# Patient Record
Sex: Male | Born: 1945
Health system: Southern US, Community
[De-identification: ages and names within clinical notes are randomized; demographics above are authoritative.]

## PROBLEM LIST (undated history)

## (undated) DIAGNOSIS — E859 Amyloidosis, unspecified: Secondary | ICD-10-CM

## (undated) DIAGNOSIS — I509 Heart failure, unspecified: Secondary | ICD-10-CM

## (undated) DIAGNOSIS — N400 Enlarged prostate without lower urinary tract symptoms: Secondary | ICD-10-CM

## (undated) DIAGNOSIS — M1990 Unspecified osteoarthritis, unspecified site: Secondary | ICD-10-CM

## (undated) DIAGNOSIS — M25551 Pain in right hip: Secondary | ICD-10-CM

## (undated) HISTORY — DX: Pain in right hip: M25.551

## (undated) HISTORY — DX: Benign prostatic hyperplasia without lower urinary tract symptoms: N40.0

## (undated) HISTORY — PX: VASECTOMY: SHX75

## (undated) HISTORY — PX: REFRACTIVE SURGERY: SHX103

---

## 2000-02-07 ENCOUNTER — Ambulatory Visit (HOSPITAL_COMMUNITY): Admission: RE | Admit: 2000-02-07 | Discharge: 2000-02-07 | Payer: Self-pay | Admitting: Family Medicine

## 2000-02-07 ENCOUNTER — Encounter: Payer: Self-pay | Admitting: Family Medicine

## 2002-07-30 ENCOUNTER — Encounter: Payer: Self-pay | Admitting: Family Medicine

## 2003-01-28 ENCOUNTER — Ambulatory Visit (HOSPITAL_COMMUNITY): Admission: RE | Admit: 2003-01-28 | Discharge: 2003-01-28 | Payer: Self-pay | Admitting: *Deleted

## 2003-01-28 ENCOUNTER — Encounter: Payer: Self-pay | Admitting: Family Medicine

## 2005-12-24 ENCOUNTER — Encounter: Admission: RE | Admit: 2005-12-24 | Discharge: 2005-12-24 | Payer: Self-pay | Admitting: Gastroenterology

## 2006-12-15 ENCOUNTER — Ambulatory Visit: Payer: Self-pay | Admitting: Family Medicine

## 2006-12-15 LAB — CONVERTED CEMR LAB
Chol/HDL Ratio, serum: 3
Cholesterol: 180 mg/dL (ref 0–200)
Glucose, Bld: 87 mg/dL (ref 70–99)
HDL: 60.7 mg/dL (ref >39.0)
LDL Cholesterol: 107 mg/dL — ABNORMAL HIGH (ref 0–99)
Triglyceride fasting, serum: 63 mg/dL (ref 0–149)
VLDL: 13 mg/dL (ref 0–40)

## 2008-01-26 ENCOUNTER — Telehealth (INDEPENDENT_AMBULATORY_CARE_PROVIDER_SITE_OTHER): Payer: Self-pay | Admitting: *Deleted

## 2008-01-28 ENCOUNTER — Ambulatory Visit: Payer: Self-pay | Admitting: Family Medicine

## 2008-01-28 DIAGNOSIS — J069 Acute upper respiratory infection, unspecified: Secondary | ICD-10-CM | POA: Insufficient documentation

## 2008-01-28 LAB — CONVERTED CEMR LAB: Rapid Strep: NEGATIVE

## 2009-04-07 ENCOUNTER — Encounter (INDEPENDENT_AMBULATORY_CARE_PROVIDER_SITE_OTHER): Payer: Self-pay | Admitting: *Deleted

## 2009-04-07 ENCOUNTER — Ambulatory Visit: Payer: Self-pay | Admitting: Family Medicine

## 2009-04-07 DIAGNOSIS — N400 Enlarged prostate without lower urinary tract symptoms: Secondary | ICD-10-CM | POA: Insufficient documentation

## 2009-04-07 LAB — HM COLONOSCOPY

## 2009-04-10 ENCOUNTER — Ambulatory Visit: Payer: Self-pay | Admitting: Family Medicine

## 2009-04-11 ENCOUNTER — Encounter (INDEPENDENT_AMBULATORY_CARE_PROVIDER_SITE_OTHER): Payer: Self-pay | Admitting: *Deleted

## 2009-04-11 LAB — CONVERTED CEMR LAB
ALT: 23 units/L (ref 0–53)
AST: 24 units/L (ref 0–37)
Albumin: 4 g/dL (ref 3.5–5.2)
Alkaline Phosphatase: 74 units/L (ref 39–117)
BUN: 14 mg/dL (ref 6–23)
Basophils Absolute: 0 10*3/uL (ref 0.0–0.1)
Basophils Relative: 0.8 % (ref 0.0–3.0)
Bilirubin, Direct: 0.1 mg/dL (ref 0.0–0.3)
CO2: 30 meq/L (ref 19–32)
Calcium: 8.8 mg/dL (ref 8.4–10.5)
Chloride: 112 meq/L (ref 96–112)
Cholesterol: 189 mg/dL (ref 0–200)
Creatinine, Ser: 1.2 mg/dL (ref 0.4–1.5)
Eosinophils Absolute: 0.1 10*3/uL (ref 0.0–0.7)
Eosinophils Relative: 2.4 % (ref 0.0–5.0)
GFR calc non Af Amer: 65.15 mL/min (ref >60)
Glucose, Bld: 83 mg/dL (ref 70–99)
HCT: 39.9 % (ref 39.0–52.0)
HDL: 59.9 mg/dL (ref >39.00)
Hemoglobin: 14 g/dL (ref 13.0–17.0)
LDL Cholesterol: 116 mg/dL — ABNORMAL HIGH (ref 0–99)
Lymphocytes Relative: 34.8 % (ref 12.0–46.0)
Lymphs Abs: 1.5 10*3/uL (ref 0.7–4.0)
MCHC: 35.1 g/dL (ref 30.0–36.0)
MCV: 91.4 fL (ref 78.0–100.0)
Monocytes Absolute: 0.4 10*3/uL (ref 0.1–1.0)
Monocytes Relative: 8.4 % (ref 3.0–12.0)
Neutro Abs: 2.4 10*3/uL (ref 1.4–7.7)
Neutrophils Relative %: 53.6 % (ref 43.0–77.0)
PSA: 0.26 ng/mL (ref 0.10–4.00)
Platelets: 171 10*3/uL (ref 150.0–400.0)
Potassium: 4 meq/L (ref 3.5–5.1)
RBC: 4.36 M/uL (ref 4.22–5.81)
RDW: 11.8 % (ref 11.5–14.6)
Sodium: 143 meq/L (ref 135–145)
TSH: 1.99 microintl units/mL (ref 0.35–5.50)
Total Bilirubin: 1 mg/dL (ref 0.3–1.2)
Total CHOL/HDL Ratio: 3
Total Protein: 6.9 g/dL (ref 6.0–8.3)
Triglycerides: 65 mg/dL (ref 0.0–149.0)
VLDL: 13 mg/dL (ref 0.0–40.0)
WBC: 4.4 10*3/uL — ABNORMAL LOW (ref 4.5–10.5)

## 2010-08-10 ENCOUNTER — Telehealth (INDEPENDENT_AMBULATORY_CARE_PROVIDER_SITE_OTHER): Payer: Self-pay | Admitting: *Deleted

## 2010-08-10 ENCOUNTER — Ambulatory Visit: Payer: Self-pay | Admitting: Family Medicine

## 2010-08-16 LAB — CONVERTED CEMR LAB
ALT: 27 units/L (ref 0–53)
AST: 24 units/L (ref 0–37)
Albumin: 4.6 g/dL (ref 3.5–5.2)
Alkaline Phosphatase: 77 units/L (ref 39–117)
BUN: 16 mg/dL (ref 6–23)
Basophils Absolute: 0.1 10*3/uL (ref 0.0–0.1)
Basophils Relative: 0.8 % (ref 0.0–3.0)
Bilirubin, Direct: 0.2 mg/dL (ref 0.0–0.3)
CO2: 29 meq/L (ref 19–32)
Calcium: 9.4 mg/dL (ref 8.4–10.5)
Chloride: 100 meq/L (ref 96–112)
Cholesterol: 217 mg/dL — ABNORMAL HIGH (ref 0–200)
Creatinine, Ser: 1.2 mg/dL (ref 0.4–1.5)
Direct LDL: 128.6 mg/dL
Eosinophils Absolute: 0.1 10*3/uL (ref 0.0–0.7)
Eosinophils Relative: 2.2 % (ref 0.0–5.0)
GFR calc non Af Amer: 66.14 mL/min (ref >60)
Glucose, Bld: 79 mg/dL (ref 70–99)
HCT: 44.5 % (ref 39.0–52.0)
HDL: 74.7 mg/dL (ref >39.00)
Hemoglobin: 15.2 g/dL (ref 13.0–17.0)
Lymphocytes Relative: 35.6 % (ref 12.0–46.0)
Lymphs Abs: 2.2 10*3/uL (ref 0.7–4.0)
MCHC: 34.2 g/dL (ref 30.0–36.0)
MCV: 93.9 fL (ref 78.0–100.0)
Monocytes Absolute: 0.4 10*3/uL (ref 0.1–1.0)
Monocytes Relative: 6.8 % (ref 3.0–12.0)
Neutro Abs: 3.3 10*3/uL (ref 1.4–7.7)
Neutrophils Relative %: 54.6 % (ref 43.0–77.0)
PSA: 0.23 ng/mL (ref 0.10–4.00)
Platelets: 219 10*3/uL (ref 150.0–400.0)
Potassium: 4.5 meq/L (ref 3.5–5.1)
RBC: 4.73 M/uL (ref 4.22–5.81)
RDW: 13.3 % (ref 11.5–14.6)
Sodium: 137 meq/L (ref 135–145)
TSH: 2.1 microintl units/mL (ref 0.35–5.50)
Total Bilirubin: 0.9 mg/dL (ref 0.3–1.2)
Total CHOL/HDL Ratio: 3
Total Protein: 7.2 g/dL (ref 6.0–8.3)
Triglycerides: 68 mg/dL (ref 0.0–149.0)
VLDL: 13.6 mg/dL (ref 0.0–40.0)
WBC: 6.1 10*3/uL (ref 4.5–10.5)

## 2010-09-05 ENCOUNTER — Ambulatory Visit: Payer: Self-pay | Admitting: Family Medicine

## 2010-09-05 ENCOUNTER — Encounter (INDEPENDENT_AMBULATORY_CARE_PROVIDER_SITE_OTHER): Payer: Self-pay | Admitting: *Deleted

## 2010-09-06 LAB — CONVERTED CEMR LAB: Fecal Occult Bld: NEGATIVE

## 2011-01-08 NOTE — Assessment & Plan Note (Signed)
Summary: cpx/lab/cbs   Vital Signs:  Patient profile:   65 year old male Height:      72 inches Weight:      220 pounds BMI:     29.95 Pulse rate:   64 / minute BP sitting:   136 / 84  (left arm)  Vitals Entered By: Doristine Devoid CMA (August 10, 2010 8:23 AM) CC: CPX AND LABS   History of Present Illness: 65 yo man here today for CPE.  no concerns about his health.  last colonoscopy- 2004.  Preventive Screening-Counseling & Management  Alcohol-Tobacco     Alcohol drinks/day: <1     Smoking Status: never  Caffeine-Diet-Exercise     Does Patient Exercise: no      Sexual History:  currently monogamous.        Drug Use:  never.    Problems Prior to Update: 1)  Healthy Adult Male  (ICD-V70.0) 2)  Benign Prostatic Hypertrophy, Hx of  (ICD-V13.8) 3)  Uri  (ICD-465.9)  Current Medications (verified): 1)  Meloxicam 15 Mg Tabs (Meloxicam) .... One By Mouth Daily  Allergies (verified): 1)  ! Sulfa  Past History:  Past Surgical History: Last updated: 04/07/2009 2002- arthroscopic Lasik surgery  Family History: Last updated: 04/07/2009 CAD-mother, father MI age 42 HTN-mother DM-no STROKE-no COLON CA-no PROSTATE CA-no  Social History: Last updated: 04/07/2009 non smoker married, 2 sons- tyler w/ CP  Past Medical History: BPH R Hip Pain- follows w/ Delbert Harness for arthritis  Review of Systems  The patient denies anorexia, fever, weight loss, weight gain, vision loss, decreased hearing, hoarseness, chest pain, syncope, dyspnea on exertion, peripheral edema, prolonged cough, headaches, abdominal pain, melena, hematochezia, severe indigestion/heartburn, hematuria, suspicious skin lesions, depression, abnormal bleeding, enlarged lymph nodes, and testicular masses.    Physical Exam  General:  Well-developed,well-nourished,in no acute distress; alert,appropriate and cooperative throughout examination Head:  Normocephalic and atraumatic without obvious  abnormalities. No apparent alopecia or balding. Eyes:  No corneal or conjunctival inflammation noted. EOMI. Perrla. Funduscopic exam benign, without hemorrhages, exudates or papilledema. Vision grossly normal. Ears:  External ear exam shows no significant lesions or deformities.  Otoscopic examination reveals clear canals, tympanic membranes are intact bilaterally without bulging, retraction, inflammation or discharge. Hearing is grossly normal bilaterally. Nose:  External nasal examination shows no deformity or inflammation. Nasal mucosa are pink and moist without lesions or exudates. Mouth:  Oral mucosa and oropharynx without lesions or exudates.  Teeth in good repair. Neck:  No deformities, masses, or tenderness noted. Lungs:  Normal respiratory effort, chest expands symmetrically. Lungs are clear to auscultation, no crackles or wheezes. Heart:  Normal rate and regular rhythm. S1 and S2 normal without gallop, murmur, click, rub or other extra sounds. Abdomen:  Bowel sounds positive,abdomen soft and non-tender without masses, organomegaly or hernias noted. Rectal:  No external abnormalities noted. Normal sphincter tone. No rectal masses or tenderness. Genitalia:  Testes bilaterally descended without nodularity, tenderness or masses. No scrotal masses or lesions. No penis lesions or urethral discharge. Prostate:  mild enlargement but symmetric, non tender Pulses:  +2 carotid, radial, DP Extremities:  No clubbing, cyanosis, edema, or deformity noted with normal full range of motion of all joints.   Neurologic:  No cranial nerve deficits noted. Station and gait are normal. Plantar reflexes are down-going bilaterally. DTRs are symmetrical throughout. Sensory, motor and coordinative functions appear intact. Skin:  Intact without suspicious lesions or rashes Cervical Nodes:  No lymphadenopathy noted Inguinal Nodes:  No  significant adenopathy Psych:  Cognition and judgment appear intact. Alert and  cooperative with normal attention span and concentration. No apparent delusions, illusions, hallucinations   Impression & Recommendations:  Problem # 1:  HEALTHY ADULT MALE (ICD-V70.0) Assessment Unchanged pt's PE WNL.  check labs.  anticipatory guidance provided. Orders: Venipuncture ([address]) TLB-Lipid Panel (80061-LIPID) TLB-BMP (Basic Metabolic Panel-BMET) (80048-METABOL) TLB-CBC Platelet - w/Differential (85025-CBCD) TLB-Hepatic/Liver Function Pnl (80076-HEPATIC) TLB-TSH (Thyroid Stimulating Hormone) (84443-TSH) TLB-PSA (Prostate Specific Antigen) (84153-PSA) Specimen Handling (47829)  Complete Medication List: 1)  Meloxicam 15 Mg Tabs (Meloxicam) .... One by mouth daily  Patient Instructions: 1)  Please schedule a follow-up appointment in 1 year or as needed. 2)  Try and make healthy food choices and get regular exercise 3)  Complete the stool test and return it as directed 4)  Call with any questions or concerns 5)  Happy Labor Day!!

## 2011-01-08 NOTE — Progress Notes (Signed)
Summary: QUESTION ABOUT MELOXICAN  Phone Note Call from Patient Call back at CELL = [phone]   Caller: Patient Summary of Call: PATIENT STATES THAT HE TAKES MELOXICAM 15 MG FOR ARTHRITIC HIP--IT WAS PRESCRIBED BY ARTHRITIS DOCTOR--(MURPHY WYNER??), BUT HE DOESNT NEED TO SEE THIS DOCTOR UNLESS HIS HIP FLARES UP---WILL DR Beverely Low PRESCRIBE THIS MEDICTION FOR HIM OR DOES HE HAVE TO GET IT FROM THE ARTHRITIS DOCTOR??  PLEASE CALL HIS CELL WITH RESPONSE--OK TO LEAVE MESSAGE Initial call taken by: Jerolyn Shin,  August 10, 2010 9:42 AM  Follow-up for Phone Call        spoke w/ patient aware prescription sent to pharmacy.......Marland KitchenDoristine Devoid CMA  August 10, 2010 2:09 PM     Prescriptions: MELOXICAM 15 MG TABS (MELOXICAM) one by mouth daily  #30 x 1   Entered by:   Doristine Devoid CMA   Authorized by:   Neena Rhymes MD   Signed by:   Doristine Devoid CMA on 08/10/2010   Method used:   Electronically to        [facility] [address] [id number] (retail)       [address]       [place], [place]  [address]       Ph: [phone]       Fax: [phone]   RxID:   [id number]

## 2011-01-08 NOTE — Letter (Signed)
Summary: [facility]: Immunoassay Fecal Occult Blood (iFOB) Order Form  [facility] [place]  [address] [place], [place] [address]   Phone: [phone]  Fax: [phone]       Lab: Immunoassay Fecal Occult Blood (iFOB) Order Form   September 05, 2010 MRN: [id number]   Bobby Harper 04-29-1946   Physicican Name:______Dr. Tabori_________  Diagnosis Code:_______V76.51___________________      Jeremy Johann CMA

## 2011-06-10 ENCOUNTER — Other Ambulatory Visit: Payer: Self-pay | Admitting: *Deleted

## 2011-06-10 MED ORDER — MELOXICAM 15 MG PO TABS: 15.0000 mg | ORAL_TABLET | Freq: Every day | ORAL | Status: DC

## 2011-06-10 NOTE — Telephone Encounter (Signed)
Ok for #30, 2 refills 

## 2011-06-10 NOTE — Telephone Encounter (Signed)
Last refilled when pt was last seen (CPX) 08/10/10. Please advise.

## 2011-06-10 NOTE — Telephone Encounter (Signed)
Refill sent.

## 2011-09-09 ENCOUNTER — Telehealth: Payer: Self-pay | Admitting: Family Medicine

## 2011-09-09 NOTE — Telephone Encounter (Signed)
Please advise 

## 2011-09-09 NOTE — Telephone Encounter (Signed)
Ok for: CBC, BMP, TSH, LFTs, lipids- dx overwt, physical PSA- dx BPH

## 2011-09-09 NOTE — Telephone Encounter (Signed)
Patient has an appt for cpx [phone] - he wants to have lab that morning - need lab order

## 2011-09-10 ENCOUNTER — Other Ambulatory Visit: Payer: Self-pay | Admitting: Family Medicine

## 2011-09-10 DIAGNOSIS — N400 Enlarged prostate without lower urinary tract symptoms: Secondary | ICD-10-CM

## 2011-09-10 DIAGNOSIS — R635 Abnormal weight gain: Secondary | ICD-10-CM

## 2011-09-10 NOTE — Telephone Encounter (Signed)
Lab appt scheduled - patient aware

## 2011-09-11 ENCOUNTER — Encounter: Payer: Self-pay | Admitting: Family Medicine

## 2011-09-11 ENCOUNTER — Ambulatory Visit (INDEPENDENT_AMBULATORY_CARE_PROVIDER_SITE_OTHER): Payer: BC Managed Care – PPO | Admitting: Family Medicine

## 2011-09-11 ENCOUNTER — Other Ambulatory Visit (INDEPENDENT_AMBULATORY_CARE_PROVIDER_SITE_OTHER): Payer: BC Managed Care – PPO

## 2011-09-11 DIAGNOSIS — N400 Enlarged prostate without lower urinary tract symptoms: Secondary | ICD-10-CM

## 2011-09-11 DIAGNOSIS — R635 Abnormal weight gain: Secondary | ICD-10-CM

## 2011-09-11 DIAGNOSIS — Z0000 Encounter for general adult medical examination without abnormal findings: Secondary | ICD-10-CM

## 2011-09-11 LAB — CBC WITH DIFFERENTIAL/PLATELET
Basophils Absolute: 0 10*3/uL (ref 0.0–0.1)
HCT: 44.8 % (ref 39.0–52.0)
Hemoglobin: 15 g/dL (ref 13.0–17.0)
Lymphs Abs: 1.9 10*3/uL (ref 0.7–4.0)
MCV: 94 fl (ref 78.0–100.0)
Monocytes Absolute: 0.4 10*3/uL (ref 0.1–1.0)
Monocytes Relative: 7 % (ref 3.0–12.0)
Neutro Abs: 3.2 10*3/uL (ref 1.4–7.7)
Platelets: 198 10*3/uL (ref 150.0–400.0)
RDW: 12.8 % (ref 11.5–14.6)

## 2011-09-11 LAB — HEPATIC FUNCTION PANEL
Albumin: 4.5 g/dL (ref 3.5–5.2)
Alkaline Phosphatase: 76 U/L (ref 39–117)
Bilirubin, Direct: 0.1 mg/dL (ref 0.0–0.3)
Total Protein: 7 g/dL (ref 6.0–8.3)

## 2011-09-11 LAB — TSH: TSH: 1.07 u[IU]/mL (ref 0.35–5.50)

## 2011-09-11 LAB — LIPID PANEL
HDL: 67 mg/dL (ref >39.00)
Triglycerides: 89 mg/dL (ref 0.0–149.0)
VLDL: 17.8 mg/dL (ref 0.0–40.0)

## 2011-09-11 LAB — BASIC METABOLIC PANEL
BUN: 14 mg/dL (ref 6–23)
Calcium: 9 mg/dL (ref 8.4–10.5)
GFR: 75.42 mL/min (ref >60.00)
Glucose, Bld: 94 mg/dL (ref 70–99)
Potassium: 4.3 mEq/L (ref 3.5–5.1)
Sodium: 141 mEq/L (ref 135–145)

## 2011-09-11 NOTE — Patient Instructions (Signed)
Your exam looks great!  Keep up the good work! We'll notify you of your lab results Call with any questions or concerns Happy Fall!!!

## 2011-09-11 NOTE — Progress Notes (Signed)
Quick Note:    Labs mailed.  ______

## 2011-09-11 NOTE — Progress Notes (Signed)
  Subjective:    Patient ID: Bobby Harper, male    DOB: 1946-11-29, 65 y.o.   MRN: [id number]  HPI CPE- no concerns today.   Review of Systems Patient reports no vision/hearing changes, anorexia, fever ,adenopathy, persistant/recurrent hoarseness, swallowing issues, chest pain, palpitations, edema, persistant/recurrent cough, hemoptysis, dyspnea (rest,exertional, paroxysmal nocturnal), gastrointestinal  bleeding (melena, rectal bleeding), abdominal pain, excessive heart burn, GU symptoms (dysuria, hematuria, voiding/incontinence issues) syncope, focal weakness, memory loss, numbness & tingling, skin/hair/nail changes, depression, anxiety, abnormal bruising/bleeding, musculoskeletal symptoms/signs.     Objective:   Physical Exam BP 128/78  Temp(Src) 98.8 F (37.1 C) (Oral)  Ht 6\' 1"  (1.854 m)  Wt 214 lb (97.07 kg)  BMI 28.23 kg/m2  General Appearance:    Alert, cooperative, no distress, appears stated age  Head:    Normocephalic, without obvious abnormality, atraumatic  Eyes:    PERRL, conjunctiva/corneas clear, EOM's intact, fundi    benign, both eyes       Ears:    Normal TM's and external ear canals, both ears  Nose:   Nares normal, septum midline, mucosa normal, no drainage   or sinus tenderness  Throat:   Lips, mucosa, and tongue normal; teeth and gums normal  Neck:   Supple, symmetrical, trachea midline, no adenopathy;       thyroid:  No enlargement/tenderness/nodules  Back:     Symmetric, no curvature, ROM normal, no CVA tenderness  Lungs:     Clear to auscultation bilaterally, respirations unlabored  Chest wall:    No tenderness or deformity  Heart:    Regular rate and rhythm, S1 and S2 normal, no murmur, rub   or gallop  Abdomen:     Soft, non-tender, bowel sounds active all four quadrants,    no masses, no organomegaly  Genitalia:    Normal male without lesion, mass, discharge or tenderness  Rectal:    Normal tone, normal prostate, no masses or tenderness    Extremities:   Extremities normal, atraumatic, no cyanosis or edema  Pulses:   2+ and symmetric all extremities  Skin:   Skin color, texture, turgor normal, no rashes or lesions  Lymph nodes:   Cervical, supraclavicular, and axillary nodes normal  Neurologic:   CNII-XII intact. Normal strength, sensation and reflexes      throughout          Assessment & Plan:

## 2011-09-11 NOTE — Progress Notes (Signed)
Labs only

## 2011-09-12 LAB — PSA: PSA: 0.25 ng/mL (ref 0.10–4.00)

## 2011-09-12 NOTE — Progress Notes (Signed)
Quick Note:    Results mailed.  ______

## 2011-09-13 ENCOUNTER — Telehealth: Payer: Self-pay | Admitting: Family Medicine

## 2011-09-13 MED ORDER — MELOXICAM 15 MG PO TABS: 15.0000 mg | ORAL_TABLET | Freq: Every day | ORAL | Status: DC

## 2011-09-13 NOTE — Telephone Encounter (Signed)
Done

## 2011-09-13 NOTE — Telephone Encounter (Signed)
Patient had appt [phone] - he said pharmacy doesn't have refill for mobic - [facility]

## 2011-09-22 DIAGNOSIS — Z0000 Encounter for general adult medical examination without abnormal findings: Secondary | ICD-10-CM | POA: Insufficient documentation

## 2011-09-22 NOTE — Assessment & Plan Note (Signed)
Pt's PE WNL.  UTD on colonoscopy.  Labs drawn this AM.  Anticipatory guidance provided.

## 2012-07-22 ENCOUNTER — Other Ambulatory Visit: Payer: Self-pay | Admitting: *Deleted

## 2012-07-22 MED ORDER — MELOXICAM 15 MG PO TABS: 15.0000 mg | ORAL_TABLET | Freq: Every day | ORAL | Status: DC

## 2012-07-22 NOTE — Telephone Encounter (Signed)
Last OV 09-11-11, last filled 09-13-11 #30 2

## 2012-07-22 NOTE — Telephone Encounter (Signed)
rx sent to pharmacy by e-script  

## 2012-07-22 NOTE — Telephone Encounter (Signed)
Ok for #30, 2 refills 

## 2012-08-13 ENCOUNTER — Ambulatory Visit (INDEPENDENT_AMBULATORY_CARE_PROVIDER_SITE_OTHER): Payer: Medicare Other | Admitting: Family Medicine

## 2012-08-13 ENCOUNTER — Encounter: Payer: Self-pay | Admitting: Family Medicine

## 2012-08-13 VITALS — BP 115/78 | HR 73 | Temp 98.8°F | Ht 72.0 in | Wt 218.2 lb

## 2012-08-13 DIAGNOSIS — M545 Low back pain, unspecified: Secondary | ICD-10-CM

## 2012-08-13 MED ORDER — CYCLOBENZAPRINE HCL 10 MG PO TABS: 10.0000 mg | ORAL_TABLET | Freq: Three times a day (TID) | ORAL | Status: AC | PRN

## 2012-08-13 MED ORDER — PREDNISONE 20 MG PO TABS: ORAL_TABLET | ORAL | Status: DC

## 2012-08-13 NOTE — Progress Notes (Signed)
  Subjective:    Patient ID: Bobby Harper, male    DOB: [birth year], 66 y.o.   MRN: [id number]  HPI Back pain- 6 days ago was installing a door panel on a car when he had a 'catch' in the lower back.  After that had difficulty standing up straight w/out feeling pressure.  Used ice and heat w/ some relief.  Got up into truck on Wednesday and 'had another twinge but it was significantly worse'.  Able to walk regularly but painfully once he's up and moving but prolonged sitting or changing positions causes 'a lot of pain'.  Able to twist and lift w/out difficulty.  Taking mobic for R hip pain (has arthritic condition).  No weakness or numbness of legs.  No radiating pain.  No bowel or bladder incontinence.   Review of Systems For ROS see HPI     Objective:   Physical Exam  Vitals reviewed. Constitutional: He appears well-developed and well-nourished. He appears distressed (obviously uncomfortable).  Musculoskeletal:       Lumbar back: He exhibits decreased range of motion (pain w/ extension>flexion), tenderness, pain and spasm. He exhibits no bony tenderness and normal pulse.       + SLR bilaterally Strength and sensation intact          Assessment & Plan:

## 2012-08-13 NOTE — Patient Instructions (Addendum)
Hold the Mobic Start the prednisone as directed- take pills at same time and w/ food Use the Flexeril at night Continue to ice If your pain changes, worsens, you develop new symptoms or things fail to improve- let me know! Call with any questions or concerns Hang in there!!

## 2012-08-20 ENCOUNTER — Encounter: Payer: Self-pay | Admitting: Family Medicine

## 2012-08-20 NOTE — Telephone Encounter (Signed)
Please advise 

## 2012-08-25 NOTE — Assessment & Plan Note (Signed)
New.  Appears to be muscular.  Start steroids due to lack of relief w/ mobic.  Flexeril prn.  If no improvement, will need ortho.  Reviewed supportive care and red flags that should prompt return.  Pt expressed understanding and is in agreement w/ plan.

## 2012-09-15 ENCOUNTER — Encounter: Payer: Self-pay | Admitting: Family Medicine

## 2012-09-15 ENCOUNTER — Ambulatory Visit (INDEPENDENT_AMBULATORY_CARE_PROVIDER_SITE_OTHER): Payer: Medicare Other | Admitting: Family Medicine

## 2012-09-15 VITALS — BP 110/75 | HR 62 | Temp 97.7°F | Ht 72.0 in | Wt 217.2 lb

## 2012-09-15 DIAGNOSIS — Z136 Encounter for screening for cardiovascular disorders: Secondary | ICD-10-CM

## 2012-09-15 DIAGNOSIS — N400 Enlarged prostate without lower urinary tract symptoms: Secondary | ICD-10-CM

## 2012-09-15 DIAGNOSIS — Z0000 Encounter for general adult medical examination without abnormal findings: Secondary | ICD-10-CM

## 2012-09-15 NOTE — Assessment & Plan Note (Signed)
Normal PE.  UTD on colonoscopy.  Labs were outstanding last year and now that pt is on medicare will hold off on repeating.  Will check every 3-5 yrs to ensure they remain normal.  EKG done- see document for interpretation.  Anticipatory guidance provided.

## 2012-09-15 NOTE — Progress Notes (Signed)
  Subjective:    Patient ID: Bobby Harper, male    DOB: 08-30-1946, 66 y.o.   MRN: [id number]  HPI Here today for CPE.  Risk Factors: BPH- waking once nightly, no weak stream or difficulty initiating.  No family hx of prostate cancer. Physical Activity: exercising regularly Fall Risk: low risk, very steady on feet Depression: no sxs Hearing: normal to conversational tones and whispered voice at 6 ft ADL's: independent Cognitive: normal linear thought process, memory and attention intact Home Safety: safe at home, lives w/ wife and son Height, Weight, BMI, Visual Acuity: see vitals, vision corrected to 20/20 w/ Lasix Counseling: UTD on colonoscopy Labs Ordered: See A&P Care Plan: See A&P    Review of Systems Patient reports no vision/hearing changes, anorexia, fever ,adenopathy, persistant/recurrent hoarseness, swallowing issues, chest pain, palpitations, edema, persistant/recurrent cough, hemoptysis, dyspnea (rest,exertional, paroxysmal nocturnal), gastrointestinal  bleeding (melena, rectal bleeding), abdominal pain, excessive heart burn, GU symptoms (dysuria, hematuria, voiding/incontinence issues) syncope, focal weakness, memory loss, numbness & tingling, skin/hair/nail changes, depression, anxiety, abnormal bruising/bleeding, musculoskeletal symptoms/signs.     Objective:   Physical Exam BP 110/75  Pulse 62  Temp 97.7 F (36.5 C) (Oral)  Ht 6' (1.829 m)  Wt 217 lb 3.2 oz (98.521 kg)  BMI 29.46 kg/m2  SpO2 98%  General Appearance:    Alert, cooperative, no distress, appears stated age  Head:    Normocephalic, without obvious abnormality, atraumatic  Eyes:    PERRL, conjunctiva/corneas clear, EOM's intact, fundi    benign, both eyes       Ears:    Normal TM's and external ear canals, both ears  Nose:   Nares normal, septum midline, mucosa normal, no drainage   or sinus tenderness  Throat:   Lips, mucosa, and tongue normal; teeth and gums normal  Neck:   Supple,  symmetrical, trachea midline, no adenopathy;       thyroid:  No enlargement/tenderness/nodules  Back:     Symmetric, no curvature, ROM normal, no CVA tenderness  Lungs:     Clear to auscultation bilaterally, respirations unlabored  Chest wall:    No tenderness or deformity  Heart:    Regular rate and rhythm, S1 and S2 normal, no murmur, rub   or gallop  Abdomen:     Soft, non-tender, bowel sounds active all four quadrants,    no masses, no organomegaly  Genitalia:    Normal male without lesion, discharge or tenderness  Rectal:    Normal tone, normal prostate, no masses or tenderness  Extremities:   Extremities normal, atraumatic, no cyanosis or edema  Pulses:   2+ and symmetric all extremities  Skin:   Skin color, texture, turgor normal, no rashes or lesions  Lymph nodes:   Cervical, supraclavicular, and axillary nodes normal  Neurologic:   CNII-XII intact. Normal strength, sensation and reflexes      throughout          Assessment & Plan:

## 2012-09-15 NOTE — Patient Instructions (Addendum)
Follow up in 1 year or as needed Keep up the good work!  You look great! Call with any questions or concerns Enjoy your race but drive safe!

## 2012-09-15 NOTE — Assessment & Plan Note (Signed)
Ongoing issue for pt.  No change in sxs.  Check PSA.

## 2012-11-16 ENCOUNTER — Other Ambulatory Visit: Payer: Self-pay | Admitting: Family Medicine

## 2012-11-16 NOTE — Telephone Encounter (Signed)
REFILL MOBIC 15 MG Take 1 tablet (15 mg total) by mouth daily. #30 LAST FILL 11.7.13, LAST OV WT/LABS 10.8.13 V70.9

## 2012-11-17 MED ORDER — MELOXICAM 15 MG PO TABS: 15.0000 mg | ORAL_TABLET | Freq: Every day | ORAL | Status: DC

## 2012-11-17 NOTE — Telephone Encounter (Signed)
Rx refill sent//AB/CMA

## 2013-01-23 ENCOUNTER — Other Ambulatory Visit: Payer: Self-pay

## 2013-03-02 ENCOUNTER — Other Ambulatory Visit: Payer: Self-pay | Admitting: Family Medicine

## 2013-03-04 ENCOUNTER — Other Ambulatory Visit: Payer: Self-pay | Admitting: Family Medicine

## 2013-03-05 NOTE — Telephone Encounter (Signed)
Last seen 09/15/12 and filled 08/13/12 #30 please advise      KP

## 2013-05-24 ENCOUNTER — Ambulatory Visit: Payer: Medicare Other | Admitting: Family Medicine

## 2013-05-26 ENCOUNTER — Ambulatory Visit (INDEPENDENT_AMBULATORY_CARE_PROVIDER_SITE_OTHER): Payer: Medicare Other | Admitting: Family Medicine

## 2013-05-26 ENCOUNTER — Encounter: Payer: Self-pay | Admitting: Family Medicine

## 2013-05-26 VITALS — BP 150/80 | HR 69 | Temp 98.3°F | Ht 71.5 in | Wt 216.0 lb

## 2013-05-26 DIAGNOSIS — Z01818 Encounter for other preprocedural examination: Secondary | ICD-10-CM

## 2013-05-26 NOTE — Patient Instructions (Addendum)
We'll fax the office note and EKG to ortho STOP the fish oil, Mobic, and vitamins prior to surgery Ask Dr Charlann Boxer about the pre-dental antibiotics, i'll be happy to prescribe them Call with any questions or concerns GOOD LUCK!!!

## 2013-05-26 NOTE — Progress Notes (Signed)
Subjective:    Bobby Harper is a 67 y.o. male who presents to the office today for a preoperative consultation at the request of surgeon Dr Charlann Boxer who plans on performing R THR anterior approach on July 22. This consultation is requested for the specific conditions prompting preoperative evaluation (i.e. because of potential affect on operative risk): none known. Planned anesthesia: general. The patient has the following known anesthesia issues: no known difficulties. Patients bleeding risk: no recent abnormal bleeding, no remote history of abnormal bleeding and currently on daily NSAID. Patient does not have objections to receiving blood products if needed.  The following portions of the patient's history were reviewed and updated as appropriate: allergies, current medications, past family history, past medical history, past social history, past surgical history and problem list.  Review of Systems A comprehensive review of systems was negative.    Objective:    BP 150/80  Pulse 69  Temp(Src) 98.3 F (36.8 C) (Oral)  Ht 5' 11.5" (1.816 m)  Wt 216 lb (97.977 kg)  BMI 29.71 kg/m2  SpO2 96%  BP 150/80  Pulse 69  Temp(Src) 98.3 F (36.8 C) (Oral)  Ht 5' 11.5" (1.816 m)  Wt 216 lb (97.977 kg)  BMI 29.71 kg/m2  SpO2 96%  General Appearance:    Alert, cooperative, no distress, appears stated age  Head:    Normocephalic, without obvious abnormality, atraumatic  Eyes:    PERRL, conjunctiva/corneas clear, EOM's intact, fundi    benign, both eyes       Ears:    Normal TM's and external ear canals, both ears  Nose:   Nares normal, septum midline, mucosa normal, no drainage   or sinus tenderness  Throat:   Lips, mucosa, and tongue normal; teeth and gums normal  Neck:   Supple, symmetrical, trachea midline, no adenopathy;       thyroid:  No enlargement/tenderness/nodules  Back:     Symmetric, no curvature, ROM normal, no CVA tenderness  Lungs:     Clear to auscultation bilaterally,  respirations unlabored  Chest wall:    No tenderness or deformity  Heart:    Regular rate and rhythm, S1 and S2 normal, no murmur, rub   or gallop  Abdomen:     Soft, non-tender, bowel sounds active all four quadrants,    no masses, no organomegaly  Genitalia:    deferred  Rectal:    Extremities:   Extremities normal, atraumatic, no cyanosis or edema  Pulses:   2+ and symmetric all extremities  Skin:   Skin color, texture, turgor normal, no rashes or lesions  Lymph nodes:   Cervical, supraclavicular, and axillary nodes normal  Neurologic:   CNII-XII intact. Normal strength, sensation and reflexes      throughout    Predictors of intubation difficulty:  Morbid obesity? no  Anatomically abnormal facies? no  Prominent incisors? no  Receding mandible? no  Short, thick neck? no  Neck range of motion: normal  Dentition: No chipped, loose, or missing teeth.  Cardiographics ECG: 1st degree heart block, mild Echocardiogram: not done  Imaging Chest x-ray: not done   Lab Review  not applicable    Assessment:      67 y.o. male with planned surgery as above.   Known risk factors for perioperative complications: None   Difficulty with intubation is not anticipated.  Cardiac Risk Estimation: low  Current medications which may produce withdrawal symptoms if withheld perioperatively: none      Plan:  1. Preoperative workup as follows ECG. 2. Change in medication regimen before surgery: discontinue NSAIDs (Mobic) 14 days before surgery. 3. Prophylaxis for cardiac events with perioperative beta-blockers: not indicated. 4. Invasive hemodynamic monitoring perioperatively: at the discretion of anesthesiologist. 5. Deep vein thrombosis prophylaxis postoperatively:regimen to be chosen by surgical team. 6. Surveillance for postoperative MI with ECG immediately postoperatively and on postoperative days 1 and 2 AND troponin levels 24 hours postoperatively and on day 4 or hospital discharge  (whichever comes first): at the discretion of anesthesiologist. 7. Other measures: none

## 2013-06-18 ENCOUNTER — Encounter (HOSPITAL_COMMUNITY): Payer: Self-pay | Admitting: Pharmacy Technician

## 2013-06-23 ENCOUNTER — Encounter (HOSPITAL_COMMUNITY)
Admission: RE | Admit: 2013-06-23 | Discharge: 2013-06-23 | Disposition: A | Discharge status: Home / Self Care | Payer: Medicare Other | Source: Ambulatory Visit | Attending: Orthopedic Surgery | Admitting: Orthopedic Surgery

## 2013-06-23 ENCOUNTER — Encounter (HOSPITAL_COMMUNITY): Payer: Self-pay

## 2013-06-23 DIAGNOSIS — M169 Osteoarthritis of hip, unspecified: Secondary | ICD-10-CM | POA: Insufficient documentation

## 2013-06-23 DIAGNOSIS — M1990 Unspecified osteoarthritis, unspecified site: Secondary | ICD-10-CM

## 2013-06-23 DIAGNOSIS — Z01812 Encounter for preprocedural laboratory examination: Secondary | ICD-10-CM | POA: Insufficient documentation

## 2013-06-23 DIAGNOSIS — M1610 Unilateral primary osteoarthritis, unspecified hip: Secondary | ICD-10-CM | POA: Insufficient documentation

## 2013-06-23 HISTORY — DX: Unspecified osteoarthritis, unspecified site: M19.90

## 2013-06-23 LAB — BASIC METABOLIC PANEL
BUN: 18 mg/dL (ref 6–23)
CO2: 27 mEq/L (ref 19–32)
Calcium: 9.3 mg/dL (ref 8.4–10.5)
GFR calc non Af Amer: 70 mL/min — ABNORMAL LOW (ref >90)
Glucose, Bld: 94 mg/dL (ref 70–99)
Sodium: 140 mEq/L (ref 135–145)

## 2013-06-23 LAB — ABO/RH: ABO/RH(D): A NEG

## 2013-06-23 LAB — SURGICAL PCR SCREEN: MRSA, PCR: NEGATIVE

## 2013-06-23 LAB — CBC
MCH: 31.4 pg (ref 26.0–34.0)
MCHC: 34.7 g/dL (ref 30.0–36.0)
MCV: 90.4 fL (ref 78.0–100.0)
Platelets: 211 10*3/uL (ref 150–400)
RBC: 4.59 MIL/uL (ref 4.22–5.81)

## 2013-06-23 LAB — URINALYSIS, ROUTINE W REFLEX MICROSCOPIC
Nitrite: NEGATIVE
Specific Gravity, Urine: 1.025 (ref 1.005–1.030)
Urobilinogen, UA: 0.2 mg/dL (ref 0.0–1.0)
pH: 5 (ref 5.0–8.0)

## 2013-06-23 LAB — PROTIME-INR: Prothrombin Time: 12.9 seconds (ref 11.6–15.2)

## 2013-06-23 NOTE — Patient Instructions (Addendum)
20 Bobby Harper  06/23/2013   Your procedure is scheduled on:  7-22 -2014  Report to [facility] at     1030   AM .  Call this number if you have problems the morning of surgery: [phone]  Or Presurgical Testing [phone](Harveer Sadler)       Do not eat food:After Midnight.  May have clear liquids:up to 6 Hours before arrival. Nothing after : 0700 AM  Clear liquids include soda, tea, black coffee, apple or grape juice, broth.  Take these medicines the morning of surgery with A SIP OF WATER: Tylenol.   Do not wear jewelry, make-up or nail polish.  Do not wear lotions, powders, or perfumes. You may wear deodorant.  Do not shave 12 hours prior to first CHG shower(legs and under arms).(face and neck okay.)  Do not bring valuables to the hospital.  Contacts, dentures or bridgework,body piercing,  may not be worn into surgery.  Leave suitcase in the car. After surgery it may be brought to your room.  For patients admitted to the hospital, checkout time is 11:00 AM the day of discharge.   Patients discharged the day of surgery will not be allowed to drive home. Must have responsible person with you x 24 hours once discharged.  Name and phone number of your driver: gentry, [place] [phone]/ [phone]  Special Instructions: CHG(Chlorhedine 4%-"Hibiclens","Betasept","Aplicare") Shower Use Special Wash: see special instructions.(avoid face and genitals)   Please read over the following fact sheets that you were given: MRSA Information, Blood Transfusion fact sheet, Incentive Spirometry Instruction.    Failure to follow these instructions may result in Cancellation of your surgery.   Patient signature_______________________________________________________

## 2013-06-23 NOTE — Pre-Procedure Instructions (Signed)
06-23-13 EKG 6'14 -Epic

## 2013-06-27 NOTE — H&P (Signed)
TOTAL HIP ADMISSION H&P  Patient is admitted for right total hip arthroplasty, anterior approach.  Subjective:  Chief Complaint: right hip OA / pain  HPI: Bobby Harper, 67 y.o. male, has a history of pain and functional disability in the right hip(s) due to arthritis and patient has failed non-surgical conservative treatments for greater than 12 weeks to include NSAID's and/or analgesics and activity modification.  Onset of symptoms was gradual starting >10 years ago with gradually worsening course since that time.The patient noted no past surgery on the right hip(s).  Patient currently rates pain in the right hip at 7 out of 10 with activity. Patient has night pain, worsening of pain with activity and weight bearing, trendelenberg gait, pain that interfers with activities of daily living, pain with passive range of motion and difficult trying to get going, but resolved slightly after moving for a while.. Patient has evidence of periarticular osteophytes and joint space narrowing by imaging studies. This condition presents safety issues increasing the risk of falls.  There is no current active signs of infection.  D/C Plans:   Home with HHPT  Post-op Meds:    Rx given for ASA, Robaxin, Iron, Colace and MiraLax  Tranexamic Acid:   To be given  Decadron:    To be given  FYI:    ASA post-op   Patient Active Problem List   Diagnosis Date Noted  . Lumbar back pain 08/13/2012  . General medical examination 09/22/2011  . BPH (benign prostatic hyperplasia) 04/07/2009  . URI 01/28/2008   Past Medical History  Diagnosis Date  . BPH (benign prostatic hyperplasia)   . Right hip pain     FOLLOWS DR. FOR ARTHRITIS  . Arthritis 06-23-13    osteoarthritis right hip/some left shoulder pain    Past Surgical History  Procedure Laterality Date  . Refractive surgery    . Vasectomy      No prescriptions prior to admission   Allergies  Allergen Reactions  . Sulfonamide Derivatives      History  Substance Use Topics  . Smoking status: Never Smoker   . Smokeless tobacco: Not on file  . Alcohol Use: Yes     Comment: weekends    Family History  Problem Relation Age of Onset  . Coronary artery disease Mother   . Hypertension Mother   . Heart attack Father   . Heart disease Father 46    MI     Review of Systems  Constitutional: Negative.   HENT: Negative.   Eyes: Negative.   Respiratory: Negative.   Cardiovascular: Negative.   Gastrointestinal: Negative.   Genitourinary: Positive for frequency.  Musculoskeletal: Positive for back pain and joint pain.  Skin: Negative.   Neurological: Negative.   Endo/Heme/Allergies: Negative.   Psychiatric/Behavioral: Negative.     Objective:  Physical Exam  Constitutional: He is oriented to person, place, and time. He appears well-developed and well-nourished.  HENT:  Head: Normocephalic and atraumatic.  Mouth/Throat: Oropharynx is clear and moist.  Eyes: Pupils are equal, round, and reactive to light.  Neck: Neck supple. No JVD present. No tracheal deviation present. No thyromegaly present.  Cardiovascular: Normal rate, regular rhythm, normal heart sounds and intact distal pulses.   Respiratory: Effort normal and breath sounds normal. No stridor. No respiratory distress. He has no wheezes.  GI: Soft. There is no tenderness. There is no guarding.  Musculoskeletal:       Right hip: He exhibits decreased range of motion, decreased strength, tenderness  and bony tenderness. He exhibits no swelling, no deformity and no laceration.  Lymphadenopathy:    He has no cervical adenopathy.  Neurological: He is alert and oriented to person, place, and time.  Skin: Skin is warm and dry.  Psychiatric: He has a normal mood and affect.   Labs:  Estimated body mass index is 29.45 kg/(m^2) as calculated from the following:   Height as of 09/15/12: 6' (1.829 m).   Weight as of 09/15/12: 98.521 kg (217 lb 3.2 oz).   Imaging  Review Plain radiographs demonstrate severe degenerative joint disease of the right hip(s). The bone quality appears to be good for age and reported activity level.  Assessment/Plan:  End stage arthritis, right hip(s)  The patient history, physical examination, clinical judgement of the provider and imaging studies are consistent with end stage degenerative joint disease of the right hip(s) and total hip arthroplasty is deemed medically necessary. The treatment options including medical management, injection therapy, arthroscopy and arthroplasty were discussed at length. The risks and benefits of total hip arthroplasty were presented and reviewed. The risks due to aseptic loosening, infection, stiffness, dislocation/subluxation,  thromboembolic complications and other imponderables were discussed.  The patient acknowledged the explanation, agreed to proceed with the plan and consent was signed. Patient is being admitted for inpatient treatment for surgery, pain control, PT, OT, prophylactic antibiotics, VTE prophylaxis, progressive ambulation and ADL's and discharge planning.The patient is planning to be discharged home with home health services.    Bobby Harper   PAC  06/27/2013, 7:55 AM

## 2013-06-29 ENCOUNTER — Inpatient Hospital Stay (HOSPITAL_COMMUNITY): Payer: Medicare Other

## 2013-06-29 ENCOUNTER — Encounter (HOSPITAL_COMMUNITY): Payer: Self-pay | Admitting: *Deleted

## 2013-06-29 ENCOUNTER — Inpatient Hospital Stay (HOSPITAL_COMMUNITY): Payer: Medicare Other | Admitting: Anesthesiology

## 2013-06-29 ENCOUNTER — Encounter (HOSPITAL_COMMUNITY): Payer: Self-pay | Admitting: Anesthesiology

## 2013-06-29 ENCOUNTER — Encounter (HOSPITAL_COMMUNITY)
Admission: RE | Disposition: A | Discharge status: Home Health | Payer: Self-pay | Source: Ambulatory Visit | Attending: Orthopedic Surgery

## 2013-06-29 ENCOUNTER — Inpatient Hospital Stay (HOSPITAL_COMMUNITY)
Admission: RE | Admit: 2013-06-29 | Discharge: 2013-06-30 | DRG: 470 | Disposition: A | Discharge status: Home Health | Payer: Medicare Other | Source: Ambulatory Visit | Attending: Orthopedic Surgery | Admitting: Orthopedic Surgery

## 2013-06-29 DIAGNOSIS — M169 Osteoarthritis of hip, unspecified: Principal | ICD-10-CM | POA: Diagnosis present

## 2013-06-29 DIAGNOSIS — D500 Iron deficiency anemia secondary to blood loss (chronic): Secondary | ICD-10-CM

## 2013-06-29 DIAGNOSIS — Z96649 Presence of unspecified artificial hip joint: Secondary | ICD-10-CM

## 2013-06-29 DIAGNOSIS — M1610 Unilateral primary osteoarthritis, unspecified hip: Principal | ICD-10-CM | POA: Diagnosis present

## 2013-06-29 DIAGNOSIS — D62 Acute posthemorrhagic anemia: Secondary | ICD-10-CM | POA: Diagnosis not present

## 2013-06-29 DIAGNOSIS — E663 Overweight: Secondary | ICD-10-CM

## 2013-06-29 DIAGNOSIS — Z01812 Encounter for preprocedural laboratory examination: Secondary | ICD-10-CM

## 2013-06-29 HISTORY — PX: TOTAL HIP ARTHROPLASTY: SHX124

## 2013-06-29 LAB — TYPE AND SCREEN: Antibody Screen: NEGATIVE

## 2013-06-29 SURGERY — ARTHROPLASTY, HIP, TOTAL, ANTERIOR APPROACH
Anesthesia: Spinal | Site: Hip | Laterality: Right | Wound class: Clean

## 2013-06-29 MED ORDER — BUPIVACAINE HCL (PF) 0.5 % IJ SOLN
INTRAMUSCULAR | Status: DC | PRN
  Administered 2013-06-29: 3 mL

## 2013-06-29 MED ORDER — ONDANSETRON HCL 4 MG PO TABS: 4.0000 mg | ORAL_TABLET | Freq: Four times a day (QID) | ORAL | Status: DC | PRN

## 2013-06-29 MED ORDER — DEXAMETHASONE SODIUM PHOSPHATE 10 MG/ML IJ SOLN
10.0000 mg | Freq: Once | INTRAMUSCULAR | Status: AC
  Administered 2013-06-29: 10 mg via INTRAVENOUS

## 2013-06-29 MED ORDER — 0.9 % SODIUM CHLORIDE (POUR BTL) OPTIME
TOPICAL | Status: DC | PRN
  Administered 2013-06-29: 1000 mL

## 2013-06-29 MED ORDER — DEXAMETHASONE SODIUM PHOSPHATE 10 MG/ML IJ SOLN
10.0000 mg | Freq: Once | INTRAMUSCULAR | Status: DC
  Filled 2013-06-29: qty 1

## 2013-06-29 MED ORDER — DIPHENHYDRAMINE HCL 12.5 MG/5ML PO ELIX
25.0000 mg | ORAL_SOLUTION | Freq: Four times a day (QID) | ORAL | Status: DC | PRN
  Filled 2013-06-29: qty 10

## 2013-06-29 MED ORDER — METHOCARBAMOL 500 MG PO TABS
500.0000 mg | ORAL_TABLET | Freq: Four times a day (QID) | ORAL | Status: DC | PRN
  Administered 2013-06-29 – 2013-06-30 (×3): 500 mg via ORAL
  Filled 2013-06-29 (×3): qty 1

## 2013-06-29 MED ORDER — FERROUS SULFATE 325 (65 FE) MG PO TABS
325.0000 mg | ORAL_TABLET | Freq: Three times a day (TID) | ORAL | Status: DC
  Administered 2013-06-29 – 2013-06-30 (×2): 325 mg via ORAL
  Filled 2013-06-29 (×5): qty 1

## 2013-06-29 MED ORDER — CEFAZOLIN SODIUM-DEXTROSE 2-3 GM-% IV SOLR
2.0000 g | INTRAVENOUS | Status: AC
  Administered 2013-06-29: 2 g via INTRAVENOUS

## 2013-06-29 MED ORDER — FENTANYL CITRATE 0.05 MG/ML IJ SOLN
INTRAMUSCULAR | Status: DC | PRN
  Administered 2013-06-29 (×4): 50 ug via INTRAVENOUS

## 2013-06-29 MED ORDER — METHOCARBAMOL 100 MG/ML IJ SOLN
500.0000 mg | Freq: Four times a day (QID) | INTRAVENOUS | Status: DC | PRN
  Filled 2013-06-29: qty 5

## 2013-06-29 MED ORDER — LACTATED RINGERS IV SOLN: INTRAVENOUS | Status: DC

## 2013-06-29 MED ORDER — ZOLPIDEM TARTRATE 5 MG PO TABS: 5.0000 mg | ORAL_TABLET | Freq: Every evening | ORAL | Status: DC | PRN

## 2013-06-29 MED ORDER — KETAMINE HCL 10 MG/ML IJ SOLN
INTRAMUSCULAR | Status: DC | PRN
  Administered 2013-06-29 (×2): 20 mg via INTRAVENOUS

## 2013-06-29 MED ORDER — MENTHOL 3 MG MT LOZG: 1.0000 | LOZENGE | OROMUCOSAL | Status: DC | PRN

## 2013-06-29 MED ORDER — HYDROMORPHONE HCL PF 1 MG/ML IJ SOLN
0.5000 mg | INTRAMUSCULAR | Status: DC | PRN
  Administered 2013-06-29: 1 mg via INTRAVENOUS
  Filled 2013-06-29: qty 1

## 2013-06-29 MED ORDER — POLYETHYLENE GLYCOL 3350 17 G PO PACK: 17.0000 g | PACK | Freq: Every day | ORAL | Status: DC | PRN

## 2013-06-29 MED ORDER — MIDAZOLAM HCL 5 MG/5ML IJ SOLN
INTRAMUSCULAR | Status: DC | PRN
  Administered 2013-06-29: 2 mg via INTRAVENOUS
  Administered 2013-06-29 (×2): 1 mg via INTRAVENOUS

## 2013-06-29 MED ORDER — DOCUSATE SODIUM 100 MG PO CAPS
100.0000 mg | ORAL_CAPSULE | Freq: Two times a day (BID) | ORAL | Status: DC
  Administered 2013-06-29 – 2013-06-30 (×2): 100 mg via ORAL

## 2013-06-29 MED ORDER — SODIUM CHLORIDE 0.9 % IV SOLN
INTRAVENOUS | Status: DC
  Administered 2013-06-29 – 2013-06-30 (×2): via INTRAVENOUS
  Filled 2013-06-29 (×8): qty 1000

## 2013-06-29 MED ORDER — PHENOL 1.4 % MT LIQD: 1.0000 | OROMUCOSAL | Status: DC | PRN

## 2013-06-29 MED ORDER — CHLORHEXIDINE GLUCONATE 4 % EX LIQD: 60.0000 mL | Freq: Once | CUTANEOUS | Status: DC

## 2013-06-29 MED ORDER — PROPOFOL INFUSION 10 MG/ML OPTIME
INTRAVENOUS | Status: DC | PRN
  Administered 2013-06-29: 100 ug/kg/min via INTRAVENOUS

## 2013-06-29 MED ORDER — TRANEXAMIC ACID 100 MG/ML IV SOLN
1000.0000 mg | Freq: Once | INTRAVENOUS | Status: AC
  Administered 2013-06-29: 1000 mg via INTRAVENOUS
  Filled 2013-06-29: qty 10

## 2013-06-29 MED ORDER — HYDROCODONE-ACETAMINOPHEN 7.5-325 MG PO TABS
1.0000 | ORAL_TABLET | ORAL | Status: DC
  Administered 2013-06-29 (×2): 1 via ORAL
  Administered 2013-06-29 – 2013-06-30 (×4): 2 via ORAL
  Filled 2013-06-29 (×2): qty 2
  Filled 2013-06-29: qty 1
  Filled 2013-06-29 (×2): qty 2
  Filled 2013-06-29: qty 1

## 2013-06-29 MED ORDER — HYDROMORPHONE HCL PF 1 MG/ML IJ SOLN: 0.2500 mg | INTRAMUSCULAR | Status: DC | PRN

## 2013-06-29 MED ORDER — ONDANSETRON HCL 4 MG/2ML IJ SOLN
INTRAMUSCULAR | Status: DC | PRN
  Administered 2013-06-29: 4 mg via INTRAVENOUS

## 2013-06-29 MED ORDER — EPHEDRINE SULFATE 50 MG/ML IJ SOLN
INTRAMUSCULAR | Status: DC | PRN
  Administered 2013-06-29 (×5): 5 mg via INTRAVENOUS

## 2013-06-29 MED ORDER — ALUM & MAG HYDROXIDE-SIMETH 200-200-20 MG/5ML PO SUSP: 30.0000 mL | ORAL | Status: DC | PRN

## 2013-06-29 MED ORDER — ASPIRIN EC 325 MG PO TBEC
325.0000 mg | DELAYED_RELEASE_TABLET | Freq: Two times a day (BID) | ORAL | Status: DC
  Administered 2013-06-29 – 2013-06-30 (×2): 325 mg via ORAL
  Filled 2013-06-29 (×4): qty 1

## 2013-06-29 MED ORDER — ONDANSETRON HCL 4 MG/2ML IJ SOLN: 4.0000 mg | Freq: Four times a day (QID) | INTRAMUSCULAR | Status: DC | PRN

## 2013-06-29 MED ORDER — SENNA 8.6 MG PO TABS
1.0000 | ORAL_TABLET | Freq: Two times a day (BID) | ORAL | Status: DC
  Administered 2013-06-30: 8.6 mg via ORAL
  Filled 2013-06-29: qty 1

## 2013-06-29 MED ORDER — LACTATED RINGERS IV SOLN
INTRAVENOUS | Status: DC
  Administered 2013-06-29: 13:00:00 via INTRAVENOUS
  Administered 2013-06-29: 1000 mL via INTRAVENOUS

## 2013-06-29 MED ORDER — CEFAZOLIN SODIUM-DEXTROSE 2-3 GM-% IV SOLR
2.0000 g | Freq: Four times a day (QID) | INTRAVENOUS | Status: AC
  Administered 2013-06-29 – 2013-06-30 (×2): 2 g via INTRAVENOUS
  Filled 2013-06-29 (×2): qty 50

## 2013-06-29 SURGICAL SUPPLY — 43 items
ADH SKN CLS APL DERMABOND .7 (GAUZE/BANDAGES/DRESSINGS) ×1
BAG SPEC THK2 15X12 ZIP CLS (MISCELLANEOUS) ×2
BAG ZIPLOCK 12X15 (MISCELLANEOUS) ×4 IMPLANT
BLADE SAW SGTL 18X1.27X75 (BLADE) ×2 IMPLANT
CAPT HIP PF COP ×1 IMPLANT
CLOTH BEACON ORANGE TIMEOUT ST (SAFETY) ×2 IMPLANT
DERMABOND ADVANCED (GAUZE/BANDAGES/DRESSINGS) ×1
DERMABOND ADVANCED .7 DNX12 (GAUZE/BANDAGES/DRESSINGS) ×1 IMPLANT
DRAPE C-ARM 42X120 X-RAY (DRAPES) ×2 IMPLANT
DRAPE STERI IOBAN 125X83 (DRAPES) ×2 IMPLANT
DRAPE U-SHAPE 47X51 STRL (DRAPES) ×6 IMPLANT
DRSG AQUACEL AG ADV 3.5X10 (GAUZE/BANDAGES/DRESSINGS) ×2 IMPLANT
DRSG TEGADERM 4X4.75 (GAUZE/BANDAGES/DRESSINGS) ×1 IMPLANT
DURAPREP 26ML APPLICATOR (WOUND CARE) ×2 IMPLANT
ELECT BLADE TIP CTD 4 INCH (ELECTRODE) ×2 IMPLANT
ELECT REM PT RETURN 9FT ADLT (ELECTROSURGICAL) ×2
ELECTRODE REM PT RTRN 9FT ADLT (ELECTROSURGICAL) ×1 IMPLANT
EVACUATOR 1/8 PVC DRAIN (DRAIN) ×1 IMPLANT
FACESHIELD LNG OPTICON STERILE (SAFETY) ×9 IMPLANT
GAUZE SPONGE 2X2 8PLY STRL LF (GAUZE/BANDAGES/DRESSINGS) ×1 IMPLANT
GLOVE BIOGEL PI IND STRL 7.5 (GLOVE) ×1 IMPLANT
GLOVE BIOGEL PI IND STRL 8 (GLOVE) ×1 IMPLANT
GLOVE BIOGEL PI INDICATOR 7.5 (GLOVE) ×1
GLOVE BIOGEL PI INDICATOR 8 (GLOVE) ×2
GLOVE ECLIPSE 7.5 STRL STRAW (GLOVE) ×2 IMPLANT
GLOVE ECLIPSE 8.0 STRL XLNG CF (GLOVE) ×3 IMPLANT
GLOVE ORTHO TXT STRL SZ7.5 (GLOVE) ×4 IMPLANT
GOWN BRE IMP PREV XXLGXLNG (GOWN DISPOSABLE) ×5 IMPLANT
GOWN STRL NON-REIN LRG LVL3 (GOWN DISPOSABLE) ×2 IMPLANT
KIT BASIN OR (CUSTOM PROCEDURE TRAY) ×2 IMPLANT
MARKER SKIN DUAL TIP RULER LAB (MISCELLANEOUS) ×1 IMPLANT
PACK TOTAL JOINT (CUSTOM PROCEDURE TRAY) ×2 IMPLANT
PADDING CAST COTTON 6X4 STRL (CAST SUPPLIES) ×2 IMPLANT
SPONGE GAUZE 2X2 STER 10/PKG (GAUZE/BANDAGES/DRESSINGS) ×1
SUCTION FRAZIER 12FR DISP (SUCTIONS) ×2 IMPLANT
SUT MNCRL AB 4-0 PS2 18 (SUTURE) ×2 IMPLANT
SUT VIC AB 1 CT1 36 (SUTURE) ×8 IMPLANT
SUT VIC AB 2-0 CT1 27 (SUTURE) ×4
SUT VIC AB 2-0 CT1 TAPERPNT 27 (SUTURE) ×2 IMPLANT
SUT VLOC 180 0 24IN GS25 (SUTURE) ×2 IMPLANT
TOWEL OR 17X26 10 PK STRL BLUE (TOWEL DISPOSABLE) ×4 IMPLANT
TRAY FOLEY CATH 14FRSI W/METER (CATHETERS) ×2 IMPLANT
WATER STERILE IRR 1500ML POUR (IV SOLUTION) ×2 IMPLANT

## 2013-06-29 NOTE — Interval H&P Note (Signed)
History and Physical Interval Note:  06/29/2013 11:33 AM  Kelli Churn  has presented today for surgery, with the diagnosis of right hip osteoarthritis  The various methods of treatment have been discussed with the patient and family. After consideration of risks, benefits and other options for treatment, the patient has consented to  Procedure(s): RIGHT TOTAL HIP ARTHROPLASTY ANTERIOR APPROACH (Right) as a surgical intervention .  The patient's history has been reviewed, patient examined, no change in status, stable for surgery.  I have reviewed the patient's chart and labs.  Questions were answered to the patient's satisfaction.     Shelda Pal

## 2013-06-29 NOTE — Transfer of Care (Signed)
Immediate Anesthesia Transfer of Care Note  Patient: Bobby Harper  Procedure(s) Performed: Procedure(s): RIGHT TOTAL HIP ARTHROPLASTY ANTERIOR APPROACH (Right)  Patient Location: PACU  Anesthesia Type:Spinal  Level of Consciousness: awake, alert , oriented and patient cooperative  Airway & Oxygen Therapy: Patient Spontanous Breathing and Patient connected to face mask oxygen  Post-op Assessment: Report given to PACU RN, Post -op Vital signs reviewed and stable and SAB Level T11.  Post vital signs: Reviewed and stable  Complications: No apparent anesthesia complications

## 2013-06-29 NOTE — Anesthesia Postprocedure Evaluation (Signed)
  Anesthesia Post-op Note  Patient: Bobby Harper  Procedure(s) Performed: Procedure(s) (LRB): RIGHT TOTAL HIP ARTHROPLASTY ANTERIOR APPROACH (Right)  Patient Location: PACU  Anesthesia Type: Spinal  Level of Consciousness: awake and alert   Airway and Oxygen Therapy: Patient Spontanous Breathing  Post-op Pain: mild  Post-op Assessment: Post-op Vital signs reviewed, Patient's Cardiovascular Status Stable, Respiratory Function Stable, Patent Airway and No signs of Nausea or vomiting  Last Vitals:  Filed Vitals:   06/29/13 1445  BP: 147/73  Pulse: 59  Temp:   Resp: 8    Post-op Vital Signs: stable   Complications: No apparent anesthesia complications

## 2013-06-29 NOTE — Anesthesia Preprocedure Evaluation (Signed)
Anesthesia Evaluation  Patient identified by MRN, date of birth, ID band Patient awake    Reviewed: Allergy & Precautions, H&P , NPO status , Patient's Chart, lab work & pertinent test results  Airway Mallampati: II TM Distance: >3 FB Neck ROM: full    Dental  (+) Caps and Dental Advisory Given All upper front are capped:   Pulmonary neg pulmonary ROS,  breath sounds clear to auscultation  Pulmonary exam normal       Cardiovascular Exercise Tolerance: Good negative cardio ROS  Rhythm:regular Rate:Normal     Neuro/Psych negative neurological ROS  negative psych ROS   GI/Hepatic negative GI ROS, Neg liver ROS,   Endo/Other  negative endocrine ROS  Renal/GU negative Renal ROS  negative genitourinary   Musculoskeletal   Abdominal   Peds  Hematology negative hematology ROS (+)   Anesthesia Other Findings   Reproductive/Obstetrics negative OB ROS                           Anesthesia Physical Anesthesia Plan  ASA: I  Anesthesia Plan: Spinal   Post-op Pain Management:    Induction:   Airway Management Planned: Simple Face Mask  Additional Equipment:   Intra-op Plan:   Post-operative Plan:   Informed Consent: I have reviewed the patients History and Physical, chart, labs and discussed the procedure including the risks, benefits and alternatives for the proposed anesthesia with the patient or authorized representative who has indicated his/her understanding and acceptance.   Dental Advisory Given  Plan Discussed with: CRNA and Surgeon  Anesthesia Plan Comments:         Anesthesia Quick Evaluation

## 2013-06-29 NOTE — Op Note (Signed)
NAME:  Bobby Harper                ACCOUNT NO.: [id number]      MEDICAL RECORD NO.: [id number]      FACILITY:  [facility]      PHYSICIAN:  Durene Romans D  DATE OF BIRTH:  [birth year]     DATE OF PROCEDURE:  06/29/2013                                 OPERATIVE REPORT         PREOPERATIVE DIAGNOSIS: Right  hip osteoarthritis.      POSTOPERATIVE DIAGNOSIS:  Right hip osteoarthritis.      PROCEDURE:  Right total hip replacement through an anterior approach   utilizing DePuy THR system, component size 56mm pinnacle cup, a size 36+4 neutral   Altrex liner, a size 8 Hi Tri Lock stem with a 36+1.5 delta ceramic   ball.      SURGEON:  Madlyn Frankel. Charlann Boxer, M.D.      ASSISTANT:  Lanney Gins, PA-C      ANESTHESIA:  Spinal.      SPECIMENS:  None.      COMPLICATIONS:  None.      BLOOD LOSS:  500 cc     DRAINS:  One Hemovac.      INDICATION OF THE PROCEDURE:  Bobby Harper is a 67 y.o. male who had   presented to office for evaluation of right hip pain.  Radiographs revealed   progressive degenerative changes with bone-on-bone   articulation to the  hip joint.  The patient had painful limited range of   motion significantly affecting their overall quality of life.  The patient was failing to    respond to conservative measures, and at this point was ready   to proceed with more definitive measures.  The patient has noted progressive   degenerative changes in his hip, progressive problems and dysfunction   with regarding the hip prior to surgery.  Consent was obtained for   benefit of pain relief.  Specific risk of infection, DVT, component   failure, dislocation, need for revision surgery, as well discussion of   the anterior versus posterior approach were reviewed.  Consent was   obtained for benefit of anterior pain relief through an anterior   approach.      PROCEDURE IN DETAIL:  The patient was brought to operative theater.   Once adequate anesthesia,  preoperative antibiotics, 2gm Ancef administered.   The patient was positioned supine on the OSI Hanna table.  Once adequate   padding of boney process was carried out, we had predraped out the hip, and  used fluoroscopy to confirm orientation of the pelvis and position.      The right hip was then prepped and draped from proximal iliac crest to   mid thigh with shower curtain technique.      Time-out was performed identifying the patient, planned procedure, and   extremity.     An incision was then made 2 cm distal and lateral to the   anterior superior iliac spine extending over the orientation of the   tensor fascia lata muscle and sharp dissection was carried down to the   fascia of the muscle and protractor placed in the soft tissues.      The fascia was then incised.  The muscle belly was identified and  swept   laterally and retractor placed along the superior neck.  Following   cauterization of the circumflex vessels and removing some pericapsular   fat, a second cobra retractor was placed on the inferior neck.  A third   retractor was placed on the anterior acetabulum after elevating the   anterior rectus.  A L-capsulotomy was along the line of the   superior neck to the trochanteric fossa, then extended proximally and   distally.  Tag sutures were placed and the retractors were then placed   intracapsular.  We then identified the trochanteric fossa and   orientation of my neck cut, confirmed this radiographically   and then made a neck osteotomy with the femur on traction.  The femoral   head was removed without difficulty or complication.  Traction was let   off and retractors were placed posterior and anterior around the   acetabulum.      The labrum and foveal tissue were debrided.  I began reaming with a 51mm   reamer and reamed up to 55mm reamer with good bony bed preparation and a 56   cup was chosen.  The final 56mm Pinnacle cup was then impacted under fluoroscopy  to  confirm the depth of penetration and orientation with respect to   abduction.  A screw was placed followed by the hole eliminator.  The final   36+4 neutral Altrex liner was impacted with good visualized rim fit.  The cup was positioned anatomically within the acetabular portion of the pelvis.      At this point, the femur was rolled at 80 degrees.  Further capsule was   released off the inferior aspect of the femoral neck.  I then   released the superior capsule proximally.  The hook was placed laterally   along the femur and elevated manually and held in position with the bed   hook.  The leg was then extended and adducted with the leg rolled to 100   degrees of external rotation.  Once the proximal femur was fully   exposed, I used a box osteotome to set orientation.  I then began   broaching with the starting chili pepper broach and passed this by hand and then broached up to 8.  With the 8 broach in place I chose a high offset neck and did a trial reduction with the 36+1.5 trial head.  The offset was appropriate, leg lengths   appeared to be equal, confirmed radiographically.   Given these findings, I went ahead and dislocated the hip, repositioned all   retractors and positioned the right hip in the extended and abducted position.  The final 8 Hi Tri Lock stem was   chosen and it was impacted down to the level of neck cut.  Based on this   and the trial reduction, a 36+1.5 delta ceramic ball was chosen and   impacted onto a clean and dry trunnion, and the hip was reduced.  The   hip had been irrigated throughout the case again at this point.  I did   reapproximate the superior capsular leaflet to the anterior leaflet   using #1 Vicryl, placed a medium Hemovac drain deep.  The fascia of the   tensor fascia lata muscle was then reapproximated using #1 Vicryl.  The   remaining wound was closed with 2-0 Vicryl and running 4-0 Monocryl.   The hip was cleaned, dried, and dressed sterilely  using Dermabond and   Aquacel dressing.  Drain site dressed separately.  She was then brought   to recovery room in stable condition tolerating the procedure well.    Lanney Gins, PA-C was present for the case involved from perioperative retractor management, general   facilitation of the case, as well as primary wound closure as assistant.            Madlyn Frankel Charlann Boxer, M.D.            MDO/MEDQ  D:  10/01/2011  T:  10/01/2011  Job:  [id number]      Electronically Signed by Durene Romans M.D. on 10/07/2011 09:15:38 AM

## 2013-06-29 NOTE — Anesthesia Procedure Notes (Signed)
Spinal  Patient location during procedure: OR Start time: 06/29/2013 12:19 PM End time: 06/29/2013 12:25 PM Staffing Anesthesiologist: Ronelle Nigh L Performed by: anesthesiologist  Preanesthetic Checklist Completed: patient identified, site marked, surgical consent, pre-op evaluation, timeout performed, IV checked, risks and benefits discussed and monitors and equipment checked Spinal Block Patient position: sitting Prep: Betadine Patient monitoring: heart rate, continuous pulse ox and blood pressure Approach: midline Location: L3-4 Injection technique: single-shot Needle Needle type: Quincke  Needle gauge: 25 G Needle length: 9 cm Assessment Sensory level: T6 Additional Notes Expiration date of kit checked and confirmed. Patient tolerated procedure well, without complications.

## 2013-06-30 ENCOUNTER — Encounter (HOSPITAL_COMMUNITY): Payer: Self-pay | Admitting: Orthopedic Surgery

## 2013-06-30 DIAGNOSIS — D500 Iron deficiency anemia secondary to blood loss (chronic): Secondary | ICD-10-CM

## 2013-06-30 DIAGNOSIS — E663 Overweight: Secondary | ICD-10-CM

## 2013-06-30 LAB — CBC
Hemoglobin: 11.2 g/dL — ABNORMAL LOW (ref 13.0–17.0)
MCH: 30.9 pg (ref 26.0–34.0)
MCHC: 34.3 g/dL (ref 30.0–36.0)
Platelets: 159 10*3/uL (ref 150–400)

## 2013-06-30 LAB — BASIC METABOLIC PANEL
Calcium: 8.5 mg/dL (ref 8.4–10.5)
GFR calc non Af Amer: 84 mL/min — ABNORMAL LOW (ref >90)
Glucose, Bld: 119 mg/dL — ABNORMAL HIGH (ref 70–99)
Sodium: 136 mEq/L (ref 135–145)

## 2013-06-30 MED ORDER — POLYETHYLENE GLYCOL 3350 17 G PO PACK: 17.0000 g | PACK | Freq: Every day | ORAL | Status: DC | PRN

## 2013-06-30 MED ORDER — HYDROCODONE-ACETAMINOPHEN 7.5-325 MG PO TABS: 1.0000 | ORAL_TABLET | ORAL | Status: DC | PRN

## 2013-06-30 MED ORDER — METHOCARBAMOL 500 MG PO TABS: 500.0000 mg | ORAL_TABLET | Freq: Four times a day (QID) | ORAL | Status: DC | PRN

## 2013-06-30 MED ORDER — ASPIRIN 325 MG PO TBEC: 325.0000 mg | DELAYED_RELEASE_TABLET | Freq: Two times a day (BID) | ORAL | Status: AC

## 2013-06-30 MED ORDER — FERROUS SULFATE 325 (65 FE) MG PO TABS: 325.0000 mg | ORAL_TABLET | Freq: Three times a day (TID) | ORAL | Status: DC

## 2013-06-30 MED ORDER — DSS 100 MG PO CAPS: 100.0000 mg | ORAL_CAPSULE | Freq: Two times a day (BID) | ORAL | Status: DC

## 2013-06-30 NOTE — Progress Notes (Signed)
Physical Therapy Treatment Patient Details Name: Bobby Harper MRN: [id number] DOB: [birth year] Today's Date: 06/30/2013 Time: 7829-5621 PT Time Calculation (min): 22 min  PT Assessment / Plan / Recommendation  History of Present Illness Pt is s/p R direct anterior THA   Clinical Impression    PT Comments   Pt with c/o fatigue and feeling warm.  Pt returned to sitting before becoming unresponsive - head back and legs elevated in recliner and pt responsive within 20 seconds - BP 100/61 - RN aware.  Follow Up Recommendations  Home health PT     Does the patient have the potential to tolerate intense rehabilitation     Barriers to Discharge        Equipment Recommendations  Rolling walker with 5" wheels    Recommendations for Other Services OT consult  Frequency 7X/week   Progress towards PT Goals Progress towards PT goals: Progressing toward goals  Plan Current plan remains appropriate    Precautions / Restrictions Precautions Precautions: None Restrictions Weight Bearing Restrictions: No   Pertinent Vitals/Pain Min c/o pain - pt premed    Mobility  Transfers Sit to Stand: 5: Supervision;From chair/3-in-1;With upper extremity assist Stand to Sit: 4: Min guard;With upper extremity assist;To chair/3-in-1;4: Min assist Details for Transfer Assistance: cues for LE management Ambulation/Gait Ambulation/Gait Assistance: 4: Min assist;4: Min guard Ambulation Distance (Feet): 288 Feet Assistive device: Rolling walker Ambulation/Gait Assistance Details: cues for position from RW Gait Pattern: Step-to pattern;Step-through pattern Stairs: Yes Stairs Assistance: 4: Min guard Stair Management Technique: Two rails;Forwards;Step to pattern Number of Stairs: 2    Exercises     PT Diagnosis:    PT Problem List:   PT Treatment Interventions:     PT Goals (current goals can now be found in the care plan section) Acute Rehab PT Goals Patient Stated Goal: Golf without pain PT  Goal Formulation: With patient Time For Goal Achievement: 07/02/13 Potential to Achieve Goals: Good  Visit Information  Last PT Received On: 06/30/13 Assistance Needed: +1 History of Present Illness: Pt is s/p R direct anterior THA    Subjective Data  Subjective: I'm a little warm Patient Stated Goal: Golf without pain   Cognition  Cognition Arousal/Alertness: Awake/alert Behavior During Therapy: WFL for tasks assessed/performed Overall Cognitive Status: Within Functional Limits for tasks assessed    Balance  Balance Balance Assessed: Yes Dynamic Standing Balance Dynamic Standing - Level of Assistance: 4: Min assist (min guard)  End of Session PT - End of Session Equipment Utilized During Treatment: Gait belt Activity Tolerance: Patient tolerated treatment well;Other (comment) Patient left: in chair;with call bell/phone within reach;with nursing/sitter in room;with family/visitor present Nurse Communication: Mobility status;Other (comment)   GP     Bobby Harper 06/30/2013, 12:28 PM

## 2013-06-30 NOTE — Progress Notes (Signed)
Utilization review completed.  

## 2013-06-30 NOTE — Progress Notes (Signed)
Patient fainted during therapy @1155 . Patient BP 100/60. Patient currently alert,oriented, and responsive. [employer] PA notified of event, patient encouraged to drink fluids and have a third therapy this afternoon to evaluate.

## 2013-06-30 NOTE — Evaluation (Signed)
Occupational Therapy Evaluation Patient Details Name: Bobby Harper MRN: [id number] DOB: 1946/03/01 Today's Date: 06/30/2013 Time: 4098-1191 OT Time Calculation (min): 27 min  OT Assessment / Plan / Recommendation History of present illness Pt is s/p R direct anterior THA   Clinical Impression   All education completed and pt doing well. Wife can help PRN at home.    OT Assessment  Patient does not need any further OT services    Follow Up Recommendations  No OT follow up;Supervision/Assistance - 24 hour    Barriers to Discharge      Equipment Recommendations  None recommended by OT    Recommendations for Other Services    Frequency       Precautions / Restrictions Precautions Precautions: None Restrictions Weight Bearing Restrictions: No   Pertinent Vitals/Pain 6/10 nursing present for pain meds.    ADL  Eating/Feeding: Independent Where Assessed - Eating/Feeding: Chair Grooming: Wash/dry hands;Set up Upper Body Bathing: Chest;Right arm;Left arm;Abdomen;Set up Where Assessed - Upper Body Bathing: Unsupported sitting Lower Body Bathing: Minimal assistance Where Assessed - Lower Body Bathing: Supported sit to stand Upper Body Dressing: Set up Where Assessed - Upper Body Dressing: Unsupported sitting Lower Body Dressing: Minimal assistance Where Assessed - Lower Body Dressing: Supported sit to stand Toilet Transfer: Min Pension scheme manager Method: Other (comment) (with walker to 3in1) Toilet Transfer Equipment: Raised toilet seat with arms (or 3-in-1 over toilet) Toileting - Clothing Manipulation and Hygiene: Min guard Where Assessed - Engineer, mining and Hygiene: Standing Equipment Used: Rolling walker ADL Comments: Pt has a Sports administrator. Explained how pt can use to don short, underwear. Pt verbalized understanding and states wife can also assist. Pt has a higher toilet at home with grab bars around toilet. He estimates toilet is about 21 inches  tall. Pt practiced with higher toilet here with one grab bar and needed min assist to safely descend but when he practiced with 3in1 (closer to height of his toilet) and used both armrests to descend/stand he was min guard. Recommended  pt use the higher toilet that has both side grab bars at home. Pt states his shower is completely walk in with no ledge. Reviewed walker safety and sequence.     OT Diagnosis:    OT Problem List:   OT Treatment Interventions:     OT Goals(Current goals can be found in the care plan section)    Visit Information  Last OT Received On: 06/30/13 Assistance Needed: +1 History of Present Illness: Pt is s/p R direct anterior THA       Prior Functioning     Home Living Family/patient expects to be discharged to:: Private residence Living Arrangements: Spouse/significant other Available Help at Discharge: Family Type of Home: House Home Equipment: Shower seat - built in;Grab bars - toilet Prior Function Level of Independence: Independent Communication Communication: No difficulties         Vision/Perception     Copywriter, advertising Arousal/Alertness: Awake/alert Behavior During Therapy: WFL for tasks assessed/performed Overall Cognitive Status: Within Functional Limits for tasks assessed    Extremity/Trunk Assessment Upper Extremity Assessment Upper Extremity Assessment: Overall WFL for tasks assessed     Mobility Transfers Transfers: Sit to Stand;Stand to Sit Sit to Stand: 4: Min guard;With upper extremity assist;From toilet;From chair/3-in-1 Stand to Sit: 4: Min guard;With upper extremity assist;To chair/3-in-1;4: Min assist Details for Transfer Assistance: min assist to descend to comfort height toilet but min guard for 3in1. verbal cues for hand placement.  Exercise     Balance Balance Balance Assessed: Yes Dynamic Standing Balance Dynamic Standing - Level of Assistance: 4: Min assist (min guard)   End of Session OT - End of  Session Activity Tolerance: Patient tolerated treatment well Patient left: in chair;with call bell/phone within reach  GO     Lennox Laity [phone] 06/30/2013, 10:31 AM

## 2013-06-30 NOTE — Progress Notes (Signed)
Physical Therapy Treatment Patient Details Name: Bobby Harper MRN: [id number] DOB: 1946-10-25 Today's Date: 06/30/2013 Time: 8295-6213 PT Time Calculation (min): 20 min  PT Assessment / Plan / Recommendation  History of Present Illness     Clinical Impression    PT Comments   No c/o dizziness with activity  Follow Up Recommendations  Home health PT     Does the patient have the potential to tolerate intense rehabilitation     Barriers to Discharge        Equipment Recommendations  Rolling walker with 5" wheels    Recommendations for Other Services OT consult  Frequency 7X/week   Progress towards PT Goals Progress towards PT goals: Progressing toward goals  Plan Current plan remains appropriate    Precautions / Restrictions Precautions Precautions: None Restrictions Weight Bearing Restrictions: No   Pertinent Vitals/Pain 3/10;     Mobility  Bed Mobility Bed Mobility: Supine to Sit;Sit to Supine Supine to Sit: 5: Supervision Sit to Supine: 5: Supervision Details for Bed Mobility Assistance: pt self assisting R LE with L LE Transfers Transfers: Sit to Stand;Stand to Sit Sit to Stand: 5: Supervision Stand to Sit: 5: Supervision Details for Transfer Assistance: cues for LE management Ambulation/Gait Ambulation/Gait Assistance: 5: Supervision;4: Min guard Ambulation Distance (Feet): 250 Feet Assistive device: Rolling walker Gait Pattern: Step-to pattern;Step-through pattern    Exercises     PT Diagnosis:    PT Problem List:   PT Treatment Interventions:     PT Goals (current goals can now be found in the care plan section) Acute Rehab PT Goals Patient Stated Goal: Golf without pain PT Goal Formulation: With patient Time For Goal Achievement: 07/02/13 Potential to Achieve Goals: Good  Visit Information  Last PT Received On: 06/30/13 Assistance Needed: +1    Subjective Data  Subjective: No c/o dizziness Patient Stated Goal: Golf without pain    Cognition  Cognition Arousal/Alertness: Awake/alert Behavior During Therapy: WFL for tasks assessed/performed Overall Cognitive Status: Within Functional Limits for tasks assessed    Balance     End of Session PT - End of Session Equipment Utilized During Treatment: Gait belt Activity Tolerance: Patient tolerated treatment well Patient left: in bed;with call bell/phone within reach;with family/visitor present Nurse Communication: Mobility status   GP     Telford Archambeau 06/30/2013, 4:27 PM

## 2013-06-30 NOTE — Evaluation (Signed)
Physical Therapy Evaluation Patient Details Name: Bobby Harper MRN: [id number] DOB: 1946-11-12 Today's Date: 06/30/2013 Time: 0820-0855 PT Time Calculation (min): 35 min  PT Assessment / Plan / Recommendation History of Present Illness  Pt is s/p R direct anterior THA  Clinical Impression  Pt s/p R THR presents with decreased R LE strength/ROM and post op pain limiting functional mobility    PT Assessment  Patient needs continued PT services    Follow Up Recommendations  Home health PT    Does the patient have the potential to tolerate intense rehabilitation      Barriers to Discharge        Equipment Recommendations  Rolling walker with 5" wheels    Recommendations for Other Services OT consult   Frequency 7X/week    Precautions / Restrictions Precautions Precautions: None Restrictions Weight Bearing Restrictions: No   Pertinent Vitals/Pain 5/10; premed, ice pack provided      Mobility  Bed Mobility Bed Mobility: Supine to Sit Supine to Sit: 4: Min assist Details for Bed Mobility Assistance: cues for use of L LE and sequencing Transfers Transfers: Sit to Stand;Stand to Sit Sit to Stand: 4: Min guard;With upper extremity assist;From toilet;From chair/3-in-1 Stand to Sit: 4: Min guard;With upper extremity assist;To chair/3-in-1;4: Min assist Details for Transfer Assistance: min assist to descend to comfort height toilet but min guard for 3in1. verbal cues for hand placement. Ambulation/Gait Ambulation/Gait Assistance: 4: Min assist;4: Min Government social research officer (Feet): 200 Feet Assistive device: Rolling walker Ambulation/Gait Assistance Details: cues for initial sequence, posture and position from RW Gait Pattern: Step-to pattern;Step-through pattern Stairs: No    Exercises Total Joint Exercises Ankle Circles/Pumps: AROM;10 reps;Supine;Both Quad Sets: AROM;10 reps;Supine;Both Heel Slides: AAROM;15 reps;Right;Supine Hip ABduction/ADduction: AAROM;15  reps;Right;Supine   PT Diagnosis: Difficulty walking  PT Problem List: Decreased strength;Decreased range of motion;Decreased activity tolerance;Decreased mobility;Decreased knowledge of use of DME;Pain PT Treatment Interventions: DME instruction;Gait training;Stair training;Functional mobility training;Therapeutic activities;Therapeutic exercise;Patient/family education     PT Goals(Current goals can be found in the care plan section) Acute Rehab PT Goals Patient Stated Goal: Golf without pain PT Goal Formulation: With patient Time For Goal Achievement: 07/02/13 Potential to Achieve Goals: Good  Visit Information  Last PT Received On: 06/30/13 Assistance Needed: +1 History of Present Illness: Pt is s/p R direct anterior THA       Prior Functioning  Home Living Family/patient expects to be discharged to:: Private residence Living Arrangements: Spouse/significant other Available Help at Discharge: Family Type of Home: House Home Access: Level entry Home Layout: Multi-level Alternate Level Stairs-Number of Steps: 0 Home Equipment: Shower seat - built in;Grab bars - toilet Additional Comments: elevator between floors Prior Function Level of Independence: Independent Communication Communication: No difficulties Dominant Hand: Right    Cognition  Cognition Arousal/Alertness: Awake/alert Behavior During Therapy: WFL for tasks assessed/performed Overall Cognitive Status: Within Functional Limits for tasks assessed    Extremity/Trunk Assessment Upper Extremity Assessment Upper Extremity Assessment: Overall WFL for tasks assessed Lower Extremity Assessment Lower Extremity Assessment: RLE deficits/detail RLE Deficits / Details: hip strength 2+/5 with AAROM at hip to 90 flex and 20 abd Cervical / Trunk Assessment Cervical / Trunk Assessment: Normal   Balance Balance Balance Assessed: Yes Dynamic Standing Balance Dynamic Standing - Level of Assistance: 4: Min assist (min  guard)  End of Session PT - End of Session Equipment Utilized During Treatment: Gait belt Activity Tolerance: Patient tolerated treatment well Patient left: in chair;with call bell/phone within reach Nurse Communication:  Mobility status  GP     Bobby Harper 06/30/2013, 10:53 AM

## 2013-06-30 NOTE — Progress Notes (Signed)
[facility]  [facility] is providing the following services: RW (patient declined Com[employer] - already has one at home)  If patient discharges after hours, please call [phone].   Renard Hamper [phone] 06/30/2013, 9:49 AM

## 2013-06-30 NOTE — Progress Notes (Signed)
   Subjective: 1 Day Post-Op Procedure(s) (LRB): RIGHT TOTAL HIP ARTHROPLASTY ANTERIOR APPROACH (Right)   Patient reports pain as mild, pain well controlled. No events throughout the night. Ready to be discharged home.  Objective:   VITALS:   Filed Vitals:   06/30/13 0600  BP: 126/69  Pulse: 53  Temp: 97.9 F (36.6 C)  Resp: 18    Neurovascular intact Dorsiflexion/Plantar flexion intact Incision: dressing C/D/I No cellulitis present Compartment soft  LABS  Recent Labs  06/30/13 0413  HGB 11.2*  HCT 32.7*  WBC 10.1  PLT 159     Recent Labs  06/30/13 0413  NA 136  K 4.2  BUN 11  CREATININE 0.96  GLUCOSE 119*     Assessment/Plan: 1 Day Post-Op Procedure(s) (LRB): RIGHT TOTAL HIP ARTHROPLASTY ANTERIOR APPROACH (Right) HV drain d/c'ed Foley cath d/c'ed Advance diet Up with therapy D/C IV fluids Discharge home with home health Follow up in 2 weeks at [facility]. Follow up with OLIN,Maxamillian Tienda D in 2 weeks.  Contact information:  [facility] [address], [address] [place] [address] [phone]    Expected ABLA  Treated with iron and will observe  Overweight (BMI 25-29.9) Estimated body mass index is 28.37 kg/(m^2) as calculated from the following:   Height as of this encounter: 6\' 1"  (1.854 m).   Weight as of this encounter: 97.523 kg (215 lb). Patient also counseled that weight may inhibit the healing process Patient counseled that losing weight will help with future health issues        Anastasio Auerbach. Jakyah Bradby   PAC  06/30/2013, 9:07 AM

## 2013-07-05 NOTE — Discharge Summary (Signed)
Physician Discharge Summary  Patient ID: CHANDLAR GUICE MRN: [id number] DOB/AGE: May 29, 1946 67 y.o.  Admit date: 06/29/2013 Discharge date: 06/30/2013   Procedures:  Procedure(s) (LRB): RIGHT TOTAL HIP ARTHROPLASTY ANTERIOR APPROACH (Right)  Attending Physician:  Dr. Durene Romans   Admission Diagnoses:   Right hip OA / pain  Discharge Diagnoses:  Principal Problem:   S/P right THA, AA Active Problems:   Expected blood loss anemia   Overweight (BMI 25.0-29.9)  Past Medical History  Diagnosis Date  . BPH (benign prostatic hyperplasia)   . Right hip pain     FOLLOWS DR. FOR ARTHRITIS  . Arthritis 06-23-13    osteoarthritis right hip/some left shoulder pain    HPI:    Bobby Harper, 67 y.o. male, has a history of pain and functional disability in the right hip(s) due to arthritis and patient has failed non-surgical conservative treatments for greater than 12 weeks to include NSAID's and/or analgesics and activity modification. Onset of symptoms was gradual starting >10 years ago with gradually worsening course since that time.The patient noted no past surgery on the right hip(s). Patient currently rates pain in the right hip at 7 out of 10 with activity. Patient has night pain, worsening of pain with activity and weight bearing, trendelenberg gait, pain that interfers with activities of daily living, pain with passive range of motion and difficult trying to get going, but resolved slightly after moving for a while.. Patient has evidence of periarticular osteophytes and joint space narrowing by imaging studies. This condition presents safety issues increasing the risk of falls. There is no current active signs of infection.  PCP: Neena Rhymes, MD   Discharged Condition: good  Hospital Course:  Patient underwent the above stated procedure on 06/29/2013. Patient tolerated the procedure well and brought to the recovery room in good condition and subsequently to the floor.  POD  #1 BP: 126/69 ; Pulse: 53 ; Temp: 97.9 F (36.6 C) ; Resp: 18 Pt's foley was removed, as well as the hemovac drain removed. IV was changed to a saline lock. Patient reports pain as mild, pain well controlled. No events throughout the night. Ready to be discharged home. Neurovascular intact, dorsiflexion/plantar flexion intact, incision: dressing C/D/I, no cellulitis present and compartment soft.   LABS  Basename    HGB  11.2  HCT  32.7    Discharge Exam: General appearance: alert, cooperative and no distress Extremities: Homans sign is negative, no sign of DVT, no edema, redness or tenderness in the calves or thighs and no ulcers, gangrene or trophic changes  Disposition:   Home-Health Care Svc with follow up in 2 weeks   Follow-up Information   Follow up with Shelda Pal, MD. Schedule an appointment as soon as possible for a visit in 2 weeks.   Contact information:   [address] [place] [address] [phone]       Discharge Orders   Future Orders Complete By Expires     Call MD / Call 911  As directed     Comments:      If you experience chest pain or shortness of breath, CALL 911 and be transported to the hospital emergency room.  If you develope a fever above 101 F, pus (white drainage) or increased drainage or redness at the wound, or calf pain, call your surgeon's office.    Change dressing  As directed     Comments:      Maintain surgical dressing for  10-14 days, then replace with 4x4 guaze and tape. Keep the area dry and clean.    Constipation Prevention  As directed     Comments:      Drink plenty of fluids.  Prune juice may be helpful.  You may use a stool softener, such as Colace (over the counter) 100 mg twice a day.  Use MiraLax (over the counter) for constipation as needed.    Diet - low sodium heart healthy  As directed     Discharge instructions  As directed     Comments:      Maintain surgical dressing for 10-14 days, then replace  with gauze and tape. Keep the area dry and clean until follow up. Follow up in 2 weeks at [facility]. Call with any questions or concerns.    Increase activity slowly as tolerated  As directed     TED hose  As directed     Comments:      Use stockings (TED hose) for 2 weeks on both leg(s).  You may remove them at night for sleeping.    Weight bearing as tolerated  As directed          Medication List    STOP taking these medications       meloxicam 15 MG tablet  Commonly known as:  MOBIC      TAKE these medications       aspirin 325 MG EC tablet  Take 1 tablet (325 mg total) by mouth 2 (two) times daily.     DSS 100 MG Caps  Take 100 mg by mouth 2 (two) times daily.     ferrous sulfate 325 (65 FE) MG tablet  Take 1 tablet (325 mg total) by mouth 3 (three) times daily after meals.     fish oil-omega-3 fatty acids 1000 MG capsule  Take 1 g by mouth daily.     HYDROcodone-acetaminophen 7.5-325 MG per tablet  Commonly known as:  NORCO  Take 1-2 tablets by mouth every 4 (four) hours as needed for pain.     methocarbamol 500 MG tablet  Commonly known as:  ROBAXIN  Take 1 tablet (500 mg total) by mouth every 6 (six) hours as needed (muscle spasms).     polyethylene glycol packet  Commonly known as:  MIRALAX / GLYCOLAX  Take 17 g by mouth daily as needed.         Signed: Anastasio Auerbach. Chrisette Man   PAC  07/05/2013, 6:28 PM

## 2013-07-14 ENCOUNTER — Other Ambulatory Visit: Payer: Self-pay

## 2013-09-03 ENCOUNTER — Encounter: Payer: Self-pay | Admitting: Family Medicine

## 2013-09-03 ENCOUNTER — Ambulatory Visit (INDEPENDENT_AMBULATORY_CARE_PROVIDER_SITE_OTHER): Payer: Medicare Other | Admitting: Family Medicine

## 2013-09-03 VITALS — BP 128/84 | HR 76 | Temp 98.6°F | Wt 220.4 lb

## 2013-09-03 DIAGNOSIS — L989 Disorder of the skin and subcutaneous tissue, unspecified: Secondary | ICD-10-CM | POA: Insufficient documentation

## 2013-09-03 NOTE — Patient Instructions (Addendum)
We'll call you with your dermatology appt Call with any questions or concerns Have a great time tomorrow!!

## 2013-09-03 NOTE — Progress Notes (Signed)
  Subjective:    Patient ID: Bobby Harper, male    DOB: 1946/06/11, 67 y.o.   MRN: [id number]  HPI Spot on ear- L ear, noticed in June.  Occasional blood on pillow.  Not healing.  No pain.  Not itching   Review of Systems For ROS see HPI     Objective:   Physical Exam  Vitals reviewed. Constitutional: He appears well-developed and well-nourished. No distress.  Skin: Skin is warm and dry.  L outer ear pinna w/ ~1 cm ulcerative lesion w/ scabbing          Assessment & Plan:

## 2013-09-05 NOTE — Assessment & Plan Note (Signed)
New.  Given the fact that area is scabbed and located on outer ear- will refer to derm for complete evaluation and either tx w/ freezing or excision.  Pt expressed understanding and is in agreement w/ plan.

## 2013-09-07 ENCOUNTER — Encounter: Payer: Self-pay | Admitting: Family Medicine

## 2013-10-14 ENCOUNTER — Other Ambulatory Visit: Payer: Self-pay

## 2014-03-15 ENCOUNTER — Encounter: Payer: Self-pay | Admitting: Family Medicine

## 2014-03-15 MED ORDER — MELOXICAM 15 MG PO TABS: 15.0000 mg | ORAL_TABLET | Freq: Every day | ORAL | Status: DC

## 2014-03-15 NOTE — Telephone Encounter (Signed)
Pt sent my chart message requesting meloxicam. Pt has not been on medications in awhile (d/c 06/18/13) . And needs an OV (last seen on 09/03/13). Ok to fill?

## 2014-03-15 NOTE — Telephone Encounter (Signed)
Med filled.  

## 2014-05-26 ENCOUNTER — Encounter: Payer: Self-pay | Admitting: Family Medicine

## 2014-05-26 ENCOUNTER — Ambulatory Visit (INDEPENDENT_AMBULATORY_CARE_PROVIDER_SITE_OTHER): Payer: Medicare Other | Admitting: Family Medicine

## 2014-05-26 VITALS — BP 140/78 | HR 64 | Temp 98.6°F | Resp 16 | Wt 219.4 lb

## 2014-05-26 DIAGNOSIS — IMO0001 Reserved for inherently not codable concepts without codable children: Secondary | ICD-10-CM

## 2014-05-26 DIAGNOSIS — R030 Elevated blood-pressure reading, without diagnosis of hypertension: Secondary | ICD-10-CM

## 2014-05-26 LAB — CBC WITH DIFFERENTIAL/PLATELET
BASOS PCT: 0.8 % (ref 0.0–3.0)
Basophils Absolute: 0 10*3/uL (ref 0.0–0.1)
EOS ABS: 0.1 10*3/uL (ref 0.0–0.7)
EOS PCT: 2.8 % (ref 0.0–5.0)
HCT: 42 % (ref 39.0–52.0)
Hemoglobin: 14.2 g/dL (ref 13.0–17.0)
LYMPHS PCT: 33.5 % (ref 12.0–46.0)
Lymphs Abs: 1.4 10*3/uL (ref 0.7–4.0)
MCHC: 33.7 g/dL (ref 30.0–36.0)
MCV: 92.3 fl (ref 78.0–100.0)
MONOS PCT: 9.9 % (ref 3.0–12.0)
Monocytes Absolute: 0.4 10*3/uL (ref 0.1–1.0)
NEUTROS PCT: 53 % (ref 43.0–77.0)
Neutro Abs: 2.3 10*3/uL (ref 1.4–7.7)
Platelets: 209 10*3/uL (ref 150.0–400.0)
RBC: 4.55 Mil/uL (ref 4.22–5.81)
RDW: 13 % (ref 11.5–15.5)
WBC: 4.3 10*3/uL (ref 4.0–10.5)

## 2014-05-26 LAB — BASIC METABOLIC PANEL
BUN: 15 mg/dL (ref 6–23)
CALCIUM: 9.4 mg/dL (ref 8.4–10.5)
CO2: 27 meq/L (ref 19–32)
CREATININE: 1.2 mg/dL (ref 0.4–1.5)
Chloride: 105 mEq/L (ref 96–112)
GFR: 67.34 mL/min (ref >60.00)
GLUCOSE: 74 mg/dL (ref 70–99)
Potassium: 4 mEq/L (ref 3.5–5.1)
Sodium: 139 mEq/L (ref 135–145)

## 2014-05-26 LAB — LIPID PANEL
CHOLESTEROL: 192 mg/dL (ref 0–200)
HDL: 69 mg/dL (ref >39.00)
LDL CALC: 105 mg/dL — AB (ref 0–99)
NonHDL: 123
Total CHOL/HDL Ratio: 3
Triglycerides: 91 mg/dL (ref 0.0–149.0)
VLDL: 18.2 mg/dL (ref 0.0–40.0)

## 2014-05-26 LAB — HEPATIC FUNCTION PANEL
ALBUMIN: 4.7 g/dL (ref 3.5–5.2)
ALT: 17 U/L (ref 0–53)
AST: 20 U/L (ref 0–37)
Alkaline Phosphatase: 72 U/L (ref 39–117)
Bilirubin, Direct: 0.1 mg/dL (ref 0.0–0.3)
TOTAL PROTEIN: 6.8 g/dL (ref 6.0–8.3)
Total Bilirubin: 1 mg/dL (ref 0.2–1.2)

## 2014-05-26 LAB — TSH: TSH: 1.03 u[IU]/mL (ref 0.35–4.50)

## 2014-05-26 NOTE — Progress Notes (Signed)
Pre visit review using our clinic review tool, if applicable. No additional management support is needed unless otherwise documented below in the visit note. 

## 2014-05-26 NOTE — Assessment & Plan Note (Signed)
New.  Pt asymptomatic today.  Suspect his elevated readings were due to heat, small cuff, and anxiety about what he read on the internet.  Check labs.  Reviewed supportive care and red flags that should prompt return.  Pt expressed understanding and is in agreement w/ plan.

## 2014-05-26 NOTE — Progress Notes (Signed)
   Subjective:    Patient ID: Regino Bellow, male    DOB: 1946-05-13, 68 y.o.   MRN: [id number]  Hypertension   Elevated BP- was playing golf on Monday and on 10th hole 'i couldn't focus on the ball'.  Lasted ~15 minutes and 'then everything was fine and has been fine since'.  Checked online regarding symptoms and decided to check BP using wife's cuff.  Was 150-165/70s on multiple readings.  No CP, SOB, HAs, edema.   Review of Systems For ROS see HPI     Objective:   Physical Exam  Vitals reviewed. Constitutional: He is oriented to person, place, and time. He appears well-developed and well-nourished. No distress.  HENT:  Head: Normocephalic and atraumatic.  Eyes: Conjunctivae and EOM are normal. Pupils are equal, round, and reactive to light.  Neck: Normal range of motion. Neck supple. No thyromegaly present.  Cardiovascular: Normal rate, regular rhythm, normal heart sounds and intact distal pulses.   No murmur heard. Pulmonary/Chest: Effort normal and breath sounds normal. No respiratory distress.  Abdominal: Soft. Bowel sounds are normal. He exhibits no distension.  Musculoskeletal: He exhibits no edema.  Lymphadenopathy:    He has no cervical adenopathy.  Neurological: He is alert and oriented to person, place, and time. No cranial nerve deficit.  Skin: Skin is warm and dry.  Psychiatric: He has a normal mood and affect. His behavior is normal.          Assessment & Plan:

## 2014-05-26 NOTE — Patient Instructions (Signed)
Schedule your complete physical in 3 months (this will be a perfect chance to recheck the BP) Drink plenty of fluids Wear sunglasses when golfing Call with any questions or concerns Have a great summer!!!

## 2014-09-08 ENCOUNTER — Ambulatory Visit (INDEPENDENT_AMBULATORY_CARE_PROVIDER_SITE_OTHER): Payer: Medicare Other | Admitting: Family Medicine

## 2014-09-08 ENCOUNTER — Encounter: Payer: Self-pay | Admitting: Family Medicine

## 2014-09-08 VITALS — BP 128/76 | HR 53 | Temp 98.0°F | Resp 16 | Ht 72.5 in | Wt 219.2 lb

## 2014-09-08 DIAGNOSIS — N400 Enlarged prostate without lower urinary tract symptoms: Secondary | ICD-10-CM

## 2014-09-08 DIAGNOSIS — E663 Overweight: Secondary | ICD-10-CM

## 2014-09-08 DIAGNOSIS — Z1211 Encounter for screening for malignant neoplasm of colon: Secondary | ICD-10-CM

## 2014-09-08 DIAGNOSIS — Z0000 Encounter for general adult medical examination without abnormal findings: Secondary | ICD-10-CM

## 2014-09-08 DIAGNOSIS — Z23 Encounter for immunization: Secondary | ICD-10-CM

## 2014-09-08 LAB — CBC WITH DIFFERENTIAL/PLATELET
Basophils Absolute: 0 10*3/uL (ref 0.0–0.1)
Basophils Relative: 0.7 % (ref 0.0–3.0)
EOS PCT: 2.4 % (ref 0.0–5.0)
Eosinophils Absolute: 0.1 10*3/uL (ref 0.0–0.7)
HCT: 41 % (ref 39.0–52.0)
Hemoglobin: 13.9 g/dL (ref 13.0–17.0)
LYMPHS PCT: 44 % (ref 12.0–46.0)
Lymphs Abs: 1.9 10*3/uL (ref 0.7–4.0)
MCHC: 33.8 g/dL (ref 30.0–36.0)
MCV: 92.6 fl (ref 78.0–100.0)
MONOS PCT: 9.2 % (ref 3.0–12.0)
Monocytes Absolute: 0.4 10*3/uL (ref 0.1–1.0)
NEUTROS PCT: 43.7 % (ref 43.0–77.0)
Neutro Abs: 1.8 10*3/uL (ref 1.4–7.7)
PLATELETS: 191 10*3/uL (ref 150.0–400.0)
RBC: 4.43 Mil/uL (ref 4.22–5.81)
RDW: 12.9 % (ref 11.5–15.5)
WBC: 4.2 10*3/uL (ref 4.0–10.5)

## 2014-09-08 LAB — BASIC METABOLIC PANEL
BUN: 16 mg/dL (ref 6–23)
CHLORIDE: 107 meq/L (ref 96–112)
CO2: 28 meq/L (ref 19–32)
CREATININE: 1.2 mg/dL (ref 0.4–1.5)
Calcium: 9.1 mg/dL (ref 8.4–10.5)
GFR: 65.96 mL/min (ref >60.00)
Glucose, Bld: 84 mg/dL (ref 70–99)
Potassium: 4.4 mEq/L (ref 3.5–5.1)
Sodium: 140 mEq/L (ref 135–145)

## 2014-09-08 LAB — HEPATIC FUNCTION PANEL
ALT: 19 U/L (ref 0–53)
AST: 22 U/L (ref 0–37)
Albumin: 4.5 g/dL (ref 3.5–5.2)
Alkaline Phosphatase: 67 U/L (ref 39–117)
BILIRUBIN TOTAL: 0.8 mg/dL (ref 0.2–1.2)
Bilirubin, Direct: 0.1 mg/dL (ref 0.0–0.3)
TOTAL PROTEIN: 6.8 g/dL (ref 6.0–8.3)

## 2014-09-08 LAB — LIPID PANEL
CHOL/HDL RATIO: 3
Cholesterol: 191 mg/dL (ref 0–200)
HDL: 62.3 mg/dL (ref >39.00)
LDL Cholesterol: 117 mg/dL — ABNORMAL HIGH (ref 0–99)
NonHDL: 128.7
TRIGLYCERIDES: 61 mg/dL (ref 0.0–149.0)
VLDL: 12.2 mg/dL (ref 0.0–40.0)

## 2014-09-08 LAB — TSH: TSH: 1.86 u[IU]/mL (ref 0.35–4.50)

## 2014-09-08 LAB — PSA: PSA: 0.15 ng/mL (ref 0.10–4.00)

## 2014-09-08 NOTE — Assessment & Plan Note (Signed)
Pt mildly overweight but exercising regularly.  Check labs to risk stratify.  Pt to continue to make healthy food choices and get regular exercise.  Will follow.

## 2014-09-08 NOTE — Addendum Note (Signed)
Addended by: Kris Hartmann on: 09/08/2014 04:32 PM   Modules accepted: Orders

## 2014-09-08 NOTE — Assessment & Plan Note (Signed)
Pt currently asymptomatic.  Check PSA.  Will follow.

## 2014-09-08 NOTE — Assessment & Plan Note (Signed)
Pt's PE WNL.  Due for colonoscopy- order entered.  Written screening schedule updated and given to pt.  Check labs.  Anticipatory guidance provided.

## 2014-09-08 NOTE — Patient Instructions (Signed)
Follow up in 1 year or as needed Keep up the good work!  You look great! We'll notify you of your lab results and make any changes if needed Call with any questions or concerns Stay dry this weekend!

## 2014-09-08 NOTE — Progress Notes (Signed)
Pre visit review using our clinic review tool, if applicable. No additional management support is needed unless otherwise documented below in the visit note. 

## 2014-09-08 NOTE — Progress Notes (Signed)
   Subjective:    Patient ID: Bobby Harper, male    DOB: 03/18/1946, 68 y.o.   MRN: [id number]  HPI Here today for CPE.  Risk Factors: none Physical Activity: exercising regularly Fall Risk: low Depression: denies current sxs Hearing: normal to conversational tones, mildly decreased to whispered voice ADL's: independent Cognitive: normal linear thought process, memory and attention intact Home Safety: safe at home, lives w/ wife and disabled son Height, Weight, BMI, Visual Acuity: see vitals, vision corrected to 20/20 w/ glasses Counseling: due for colonoscopy- [employer]) Advanced Directive: pt and wife both have healthcare POA and advanced directives Labs Ordered: See A&P Care Plan: See A&P    Review of Systems Patient reports no vision/hearing changes, anorexia, fever ,adenopathy, persistant/recurrent hoarseness, swallowing issues, chest pain, palpitations, edema, persistant/recurrent cough, hemoptysis, dyspnea (rest,exertional, paroxysmal nocturnal), gastrointestinal  bleeding (melena, rectal bleeding), abdominal pain, excessive heart burn, GU symptoms (dysuria, hematuria, voiding/incontinence issues) syncope, focal weakness, memory loss, numbness & tingling, skin/hair/nail changes, depression, anxiety, abnormal bruising/bleeding, musculoskeletal symptoms/signs.     Objective:   Physical Exam BP 128/76  Pulse 53  Temp(Src) 98 F (36.7 C) (Oral)  Resp 16  Ht 6' 0.5" (1.842 m)  Wt 219 lb 4 oz (99.451 kg)  BMI 29.31 kg/m2  SpO2 97%  General Appearance:    Alert, cooperative, no distress, appears stated age  Head:    Normocephalic, without obvious abnormality, atraumatic  Eyes:    PERRL, conjunctiva/corneas clear, EOM's intact, fundi    benign, both eyes       Ears:    Normal TM's and external ear canals, both ears  Nose:   Nares normal, septum midline, mucosa normal, no drainage   or sinus tenderness  Throat:   Lips, mucosa, and tongue normal; teeth and gums normal    Neck:   Supple, symmetrical, trachea midline, no adenopathy;       thyroid:  No enlargement/tenderness/nodules  Back:     Symmetric, no curvature, ROM normal, no CVA tenderness  Lungs:     Clear to auscultation bilaterally, respirations unlabored  Chest wall:    No tenderness or deformity  Heart:    Regular rate and rhythm, S1 and S2 normal, no murmur, rub   or gallop  Abdomen:     Soft, non-tender, bowel sounds active all four quadrants,    no masses, no organomegaly  Genitalia:    Normal male without lesion, discharge or tenderness  Rectal:    Normal tone, normal prostate, no masses or tenderness  Extremities:   Extremities normal, atraumatic, no cyanosis or edema  Pulses:   2+ and symmetric all extremities  Skin:   Skin color, texture, turgor normal, no rashes or lesions  Lymph nodes:   Cervical, supraclavicular, and axillary nodes normal  Neurologic:   CNII-XII intact. Normal strength, sensation and reflexes      throughout          Assessment & Plan:

## 2014-09-22 ENCOUNTER — Other Ambulatory Visit: Payer: Self-pay | Admitting: General Practice

## 2014-09-22 MED ORDER — MELOXICAM 15 MG PO TABS: 15.0000 mg | ORAL_TABLET | Freq: Every day | ORAL | Status: DC | PRN

## 2015-08-01 ENCOUNTER — Encounter: Payer: Self-pay | Admitting: Family Medicine

## 2015-09-12 ENCOUNTER — Telehealth: Payer: Self-pay | Admitting: Behavioral Health

## 2015-09-12 ENCOUNTER — Encounter: Payer: Self-pay | Admitting: Behavioral Health

## 2015-09-12 NOTE — Telephone Encounter (Signed)
Pre-Visit Call completed with patient and chart updated.   Pre-Visit Info documented in Specialty Comments under [employer].    

## 2015-09-13 ENCOUNTER — Encounter: Payer: Self-pay | Admitting: Family Medicine

## 2015-09-13 ENCOUNTER — Ambulatory Visit (INDEPENDENT_AMBULATORY_CARE_PROVIDER_SITE_OTHER): Payer: Medicare Other | Admitting: Family Medicine

## 2015-09-13 VITALS — BP 120/78 | HR 54 | Temp 98.0°F | Resp 16 | Ht 73.0 in | Wt 216.4 lb

## 2015-09-13 DIAGNOSIS — Z1211 Encounter for screening for malignant neoplasm of colon: Secondary | ICD-10-CM

## 2015-09-13 DIAGNOSIS — Z0000 Encounter for general adult medical examination without abnormal findings: Secondary | ICD-10-CM

## 2015-09-13 DIAGNOSIS — Z23 Encounter for immunization: Secondary | ICD-10-CM | POA: Diagnosis not present

## 2015-09-13 DIAGNOSIS — B001 Herpesviral vesicular dermatitis: Secondary | ICD-10-CM | POA: Diagnosis not present

## 2015-09-13 DIAGNOSIS — E663 Overweight: Secondary | ICD-10-CM | POA: Diagnosis not present

## 2015-09-13 DIAGNOSIS — N400 Enlarged prostate without lower urinary tract symptoms: Secondary | ICD-10-CM

## 2015-09-13 LAB — LIPID PANEL
CHOLESTEROL: 180 mg/dL (ref 0–200)
HDL: 66.4 mg/dL (ref >39.00)
LDL CALC: 101 mg/dL — AB (ref 0–99)
NONHDL: 113.61
Total CHOL/HDL Ratio: 3
Triglycerides: 64 mg/dL (ref 0.0–149.0)
VLDL: 12.8 mg/dL (ref 0.0–40.0)

## 2015-09-13 LAB — CBC WITH DIFFERENTIAL/PLATELET
BASOS PCT: 0.7 % (ref 0.0–3.0)
Basophils Absolute: 0 10*3/uL (ref 0.0–0.1)
EOS PCT: 3.7 % (ref 0.0–5.0)
Eosinophils Absolute: 0.2 10*3/uL (ref 0.0–0.7)
HEMATOCRIT: 43.3 % (ref 39.0–52.0)
Hemoglobin: 14.6 g/dL (ref 13.0–17.0)
LYMPHS ABS: 2 10*3/uL (ref 0.7–4.0)
LYMPHS PCT: 40.3 % (ref 12.0–46.0)
MCHC: 33.8 g/dL (ref 30.0–36.0)
MCV: 92.1 fl (ref 78.0–100.0)
MONOS PCT: 8.9 % (ref 3.0–12.0)
Monocytes Absolute: 0.4 10*3/uL (ref 0.1–1.0)
NEUTROS ABS: 2.3 10*3/uL (ref 1.4–7.7)
NEUTROS PCT: 46.4 % (ref 43.0–77.0)
PLATELETS: 219 10*3/uL (ref 150.0–400.0)
RBC: 4.7 Mil/uL (ref 4.22–5.81)
RDW: 12.9 % (ref 11.5–15.5)
WBC: 4.9 10*3/uL (ref 4.0–10.5)

## 2015-09-13 LAB — BASIC METABOLIC PANEL
BUN: 15 mg/dL (ref 6–23)
CALCIUM: 9.6 mg/dL (ref 8.4–10.5)
CO2: 27 mEq/L (ref 19–32)
Chloride: 104 mEq/L (ref 96–112)
Creatinine, Ser: 1.17 mg/dL (ref 0.40–1.50)
GFR: 65.76 mL/min (ref >60.00)
GLUCOSE: 85 mg/dL (ref 70–99)
POTASSIUM: 4.4 meq/L (ref 3.5–5.1)
SODIUM: 139 meq/L (ref 135–145)

## 2015-09-13 LAB — TSH: TSH: 2.15 u[IU]/mL (ref 0.35–4.50)

## 2015-09-13 LAB — HEPATIC FUNCTION PANEL
ALK PHOS: 76 U/L (ref 39–117)
ALT: 20 U/L (ref 0–53)
AST: 21 U/L (ref 0–37)
Albumin: 4.7 g/dL (ref 3.5–5.2)
BILIRUBIN DIRECT: 0.2 mg/dL (ref 0.0–0.3)
BILIRUBIN TOTAL: 0.9 mg/dL (ref 0.2–1.2)
Total Protein: 6.9 g/dL (ref 6.0–8.3)

## 2015-09-13 LAB — PSA: PSA: 0.2 ng/mL (ref 0.10–4.00)

## 2015-09-13 MED ORDER — VALACYCLOVIR HCL 1 G PO TABS: ORAL_TABLET | ORAL | Status: DC

## 2015-09-13 NOTE — Progress Notes (Signed)
   Subjective:    Patient ID: Bobby Harper, male    DOB: 03-14-1946, 69 y.o.   MRN: [id number]  HPI Here today for CPE.  Risk Factors: Overweight- BMI is 28.5.  Pt is exercising regularly.  Usually eats a balanced diet.  Denies CP, SOB, HAs, visual changes, edema BPH- currently asymptomatic.  Denies urinary frequency, nocturia. Cold sores- recurrent problem for pt Physical Activity: getting regular exercise- stationary bike, walking, golf Fall Risk: low Depression: denies current sxs Hearing: decreased to whispered voice, normal to conversational tones ADL's: independent Cognitive: normal linear thought process, memory and attention are intact Home Safety: safe at home, lives w/ wife and disabled son Height, Weight, BMI, Visual Acuity: see vitals, vision corrected to 20/20 w/ glasses Counseling: due for colonoscopy (last done 2004, wants referral to [facility]) Care team reviewed w/ pt Labs Ordered: See A&P Care Plan: See A&P    Review of Systems Patient reports no vision/hearing changes, anorexia, fever ,adenopathy, persistant/recurrent hoarseness, swallowing issues, chest pain, palpitations, edema, persistant/recurrent cough, hemoptysis, dyspnea (rest,exertional, paroxysmal nocturnal), gastrointestinal  bleeding (melena, rectal bleeding), abdominal pain, excessive heart burn, GU symptoms (dysuria, hematuria, voiding/incontinence issues) syncope, focal weakness, memory loss, numbness & tingling, skin/hair/nail changes, depression, anxiety, abnormal bruising/bleeding, musculoskeletal symptoms/signs.     Objective:   Physical Exam BP 120/78 mmHg  Pulse 54  Temp(Src) 98 F (36.7 C) (Oral)  Resp 16  Ht 6\' 1"  (1.854 m)  Wt 216 lb 6 oz (98.147 kg)  BMI 28.55 kg/m2  SpO2 95%  General Appearance:    Alert, cooperative, no distress, appears stated age  Head:    Normocephalic, without obvious abnormality, atraumatic  Eyes:    PERRL, conjunctiva/corneas clear, EOM's intact, fundi   benign, both eyes       Ears:    Normal TM's and external ear canals, both ears  Nose:   Nares normal, septum midline, mucosa normal, no drainage   or sinus tenderness  Throat:   Lips, mucosa, and tongue normal; teeth and gums normal  Neck:   Supple, symmetrical, trachea midline, no adenopathy;       thyroid:  No enlargement/tenderness/nodules  Back:     Symmetric, no curvature, ROM normal, no CVA tenderness  Lungs:     Clear to auscultation bilaterally, respirations unlabored  Chest wall:    No tenderness or deformity  Heart:    Regular rate and rhythm, S1 and S2 normal, no murmur, rub   or gallop  Abdomen:     Soft, non-tender, bowel sounds active all four quadrants,    no masses, no organomegaly  Genitalia:    Normal male without lesion, discharge or tenderness  Rectal:    Normal tone, normal prostate, no masses or tenderness  Extremities:   Extremities normal, atraumatic, no cyanosis or edema  Pulses:   2+ and symmetric all extremities  Skin:   Skin color, texture, turgor normal, no rashes or lesions  Lymph nodes:   Cervical, supraclavicular, and axillary nodes normal  Neurologic:   CNII-XII intact. Normal strength, sensation and reflexes      throughout          Assessment & Plan:

## 2015-09-13 NOTE — Patient Instructions (Signed)
Follow up in 1 year or as needed We'll notify you of your lab results and make any changes if needed Keep up the good work on healthy diet and regular exercise- you look great! Call with any questions or concerns We'll call you with your GI appt for colonoscopy If you want to join [place] at the new [place] office, any scheduled appointments will automatically transfer and we will see you at [address] [place] [address] [place], [place] [address]  Happy Fall!!!

## 2015-09-13 NOTE — Progress Notes (Signed)
Pre visit review using our clinic review tool, if applicable. No additional management support is needed unless otherwise documented below in the visit note. 

## 2015-09-17 NOTE — Assessment & Plan Note (Signed)
Pt's PE WNL.  Overdue for colonoscopy- referral placed.  Flu shot given.  Check labs.  Anticipatory guidance provided.

## 2015-09-17 NOTE — Assessment & Plan Note (Signed)
New.  Asymptomatic at this time.  Check PSA.  If elevated, refer to Urology.  Pt expressed understanding and is in agreement w/ plan.

## 2015-09-17 NOTE — Assessment & Plan Note (Signed)
Ongoing issue.  Applauded pt's efforts at exercise and stressed need for healthy diet.  Check labs to risk stratify.  Will continue to follow.

## 2015-09-17 NOTE — Assessment & Plan Note (Signed)
New to provider, ongoing for pt.  Script provided for Valtrex to use PRN.

## 2015-10-01 ENCOUNTER — Encounter: Payer: Self-pay | Admitting: Family Medicine

## 2016-03-09 ENCOUNTER — Encounter: Payer: Self-pay | Admitting: Family Medicine

## 2016-03-20 DIAGNOSIS — Z85828 Personal history of other malignant neoplasm of skin: Secondary | ICD-10-CM | POA: Diagnosis not present

## 2016-03-20 DIAGNOSIS — Z08 Encounter for follow-up examination after completed treatment for malignant neoplasm: Secondary | ICD-10-CM | POA: Diagnosis not present

## 2016-03-20 DIAGNOSIS — L570 Actinic keratosis: Secondary | ICD-10-CM | POA: Diagnosis not present

## 2016-03-27 ENCOUNTER — Encounter: Payer: Self-pay | Admitting: Family Medicine

## 2016-04-02 DIAGNOSIS — L308 Other specified dermatitis: Secondary | ICD-10-CM | POA: Diagnosis not present

## 2016-04-02 DIAGNOSIS — D492 Neoplasm of unspecified behavior of bone, soft tissue, and skin: Secondary | ICD-10-CM | POA: Diagnosis not present

## 2016-04-02 DIAGNOSIS — L309 Dermatitis, unspecified: Secondary | ICD-10-CM | POA: Diagnosis not present

## 2016-04-11 ENCOUNTER — Encounter: Payer: Self-pay | Admitting: Family Medicine

## 2016-04-11 MED ORDER — MELOXICAM 15 MG PO TABS: 15.0000 mg | ORAL_TABLET | Freq: Every day | ORAL | Status: DC | PRN

## 2016-04-11 NOTE — Telephone Encounter (Signed)
Medication filled to pharmacy as requested.   

## 2016-04-18 DIAGNOSIS — Z1211 Encounter for screening for malignant neoplasm of colon: Secondary | ICD-10-CM | POA: Diagnosis not present

## 2016-04-18 DIAGNOSIS — K640 First degree hemorrhoids: Secondary | ICD-10-CM | POA: Diagnosis not present

## 2016-04-18 LAB — HM COLONOSCOPY

## 2016-04-23 ENCOUNTER — Encounter: Payer: Self-pay | Admitting: General Practice

## 2016-05-02 DIAGNOSIS — M1811 Unilateral primary osteoarthritis of first carpometacarpal joint, right hand: Secondary | ICD-10-CM | POA: Diagnosis not present

## 2016-05-02 DIAGNOSIS — M1812 Unilateral primary osteoarthritis of first carpometacarpal joint, left hand: Secondary | ICD-10-CM | POA: Diagnosis not present

## 2016-05-02 DIAGNOSIS — G5601 Carpal tunnel syndrome, right upper limb: Secondary | ICD-10-CM | POA: Diagnosis not present

## 2016-05-02 DIAGNOSIS — G5602 Carpal tunnel syndrome, left upper limb: Secondary | ICD-10-CM | POA: Diagnosis not present

## 2016-05-10 DIAGNOSIS — L309 Dermatitis, unspecified: Secondary | ICD-10-CM | POA: Diagnosis not present

## 2016-05-11 DIAGNOSIS — G5603 Carpal tunnel syndrome, bilateral upper limbs: Secondary | ICD-10-CM | POA: Diagnosis not present

## 2016-05-13 ENCOUNTER — Encounter: Payer: Self-pay | Admitting: Family Medicine

## 2016-06-07 DIAGNOSIS — M1811 Unilateral primary osteoarthritis of first carpometacarpal joint, right hand: Secondary | ICD-10-CM | POA: Diagnosis not present

## 2016-06-07 DIAGNOSIS — M1812 Unilateral primary osteoarthritis of first carpometacarpal joint, left hand: Secondary | ICD-10-CM | POA: Diagnosis not present

## 2016-06-07 DIAGNOSIS — G5602 Carpal tunnel syndrome, left upper limb: Secondary | ICD-10-CM | POA: Diagnosis not present

## 2016-06-07 DIAGNOSIS — G5601 Carpal tunnel syndrome, right upper limb: Secondary | ICD-10-CM | POA: Diagnosis not present

## 2016-06-19 DIAGNOSIS — L139 Bullous disorder, unspecified: Secondary | ICD-10-CM | POA: Diagnosis not present

## 2016-08-04 ENCOUNTER — Encounter: Payer: Self-pay | Admitting: Family Medicine

## 2016-08-28 ENCOUNTER — Encounter: Payer: Self-pay | Admitting: Family Medicine

## 2016-09-04 ENCOUNTER — Encounter: Payer: Self-pay | Admitting: Family Medicine

## 2016-09-18 ENCOUNTER — Encounter: Payer: Medicare Other | Admitting: Family Medicine

## 2016-09-19 ENCOUNTER — Ambulatory Visit (INDEPENDENT_AMBULATORY_CARE_PROVIDER_SITE_OTHER): Payer: Medicare Other | Admitting: Family Medicine

## 2016-09-19 ENCOUNTER — Encounter: Payer: Self-pay | Admitting: Family Medicine

## 2016-09-19 VITALS — BP 118/72 | HR 59 | Temp 98.5°F | Resp 16 | Ht 73.0 in | Wt 216.5 lb

## 2016-09-19 DIAGNOSIS — R351 Nocturia: Secondary | ICD-10-CM | POA: Diagnosis not present

## 2016-09-19 DIAGNOSIS — Z1159 Encounter for screening for other viral diseases: Secondary | ICD-10-CM | POA: Diagnosis not present

## 2016-09-19 DIAGNOSIS — E663 Overweight: Secondary | ICD-10-CM | POA: Diagnosis not present

## 2016-09-19 DIAGNOSIS — Z0000 Encounter for general adult medical examination without abnormal findings: Secondary | ICD-10-CM

## 2016-09-19 DIAGNOSIS — Z23 Encounter for immunization: Secondary | ICD-10-CM

## 2016-09-19 DIAGNOSIS — N401 Enlarged prostate with lower urinary tract symptoms: Secondary | ICD-10-CM | POA: Diagnosis not present

## 2016-09-19 LAB — CBC WITH DIFFERENTIAL/PLATELET
BASOS PCT: 0.9 % (ref 0.0–3.0)
Basophils Absolute: 0 10*3/uL (ref 0.0–0.1)
EOS ABS: 0.1 10*3/uL (ref 0.0–0.7)
Eosinophils Relative: 2.3 % (ref 0.0–5.0)
HEMATOCRIT: 42.8 % (ref 39.0–52.0)
Hemoglobin: 14.5 g/dL (ref 13.0–17.0)
LYMPHS ABS: 2.1 10*3/uL (ref 0.7–4.0)
LYMPHS PCT: 41.8 % (ref 12.0–46.0)
MCHC: 33.9 g/dL (ref 30.0–36.0)
MCV: 92.3 fl (ref 78.0–100.0)
Monocytes Absolute: 0.4 10*3/uL (ref 0.1–1.0)
Monocytes Relative: 7.3 % (ref 3.0–12.0)
NEUTROS ABS: 2.4 10*3/uL (ref 1.4–7.7)
Neutrophils Relative %: 47.7 % (ref 43.0–77.0)
PLATELETS: 218 10*3/uL (ref 150.0–400.0)
RBC: 4.64 Mil/uL (ref 4.22–5.81)
RDW: 13.5 % (ref 11.5–15.5)
WBC: 5.1 10*3/uL (ref 4.0–10.5)

## 2016-09-19 LAB — LIPID PANEL
CHOLESTEROL: 182 mg/dL (ref 0–200)
HDL: 59.4 mg/dL (ref >39.00)
LDL Cholesterol: 106 mg/dL — ABNORMAL HIGH (ref 0–99)
NONHDL: 123.03
Total CHOL/HDL Ratio: 3
Triglycerides: 85 mg/dL (ref 0.0–149.0)
VLDL: 17 mg/dL (ref 0.0–40.0)

## 2016-09-19 LAB — PSA, MEDICARE: PSA: 0.19 ng/ml (ref 0.10–4.00)

## 2016-09-19 LAB — HEPATIC FUNCTION PANEL
ALK PHOS: 72 U/L (ref 39–117)
ALT: 20 U/L (ref 0–53)
AST: 22 U/L (ref 0–37)
Albumin: 4.6 g/dL (ref 3.5–5.2)
BILIRUBIN DIRECT: 0.2 mg/dL (ref 0.0–0.3)
BILIRUBIN TOTAL: 0.8 mg/dL (ref 0.2–1.2)
Total Protein: 7 g/dL (ref 6.0–8.3)

## 2016-09-19 LAB — BASIC METABOLIC PANEL
BUN: 13 mg/dL (ref 6–23)
CALCIUM: 9.5 mg/dL (ref 8.4–10.5)
CHLORIDE: 103 meq/L (ref 96–112)
CO2: 31 meq/L (ref 19–32)
Creatinine, Ser: 1.14 mg/dL (ref 0.40–1.50)
GFR: 67.56 mL/min (ref >60.00)
Glucose, Bld: 84 mg/dL (ref 70–99)
POTASSIUM: 4.5 meq/L (ref 3.5–5.1)
SODIUM: 140 meq/L (ref 135–145)

## 2016-09-19 LAB — TSH: TSH: 2.33 u[IU]/mL (ref 0.35–4.50)

## 2016-09-19 NOTE — Assessment & Plan Note (Signed)
No change in nocturia or other sxs.  Check PSA.

## 2016-09-19 NOTE — Assessment & Plan Note (Signed)
Ongoing issue for pt.  Stressed need for healthy diet and regular exercise.  Check labs to risk stratify.  Will follow 

## 2016-09-19 NOTE — Progress Notes (Signed)
   Subjective:    Patient ID: Bobby Harper, male    DOB: [birth year], 70 y.o.   MRN: [id number]  HPI Here today for CPE.  Risk Factors: Overweight- ongoing issue for pt.  Exercising regularly, not following particular diet Physical Activity: exercising regularly Fall Risk: low Depression: denies current sxs Hearing: decreased to whispered voice, normal to conversational tones ADL's: independent Cognitive: normal linear thought process, memory and attention intact Home Safety: safe at home, lives w/ wife and son Height, Weight, BMI, Visual Acuity: see vitals, vision corrected to 20/20 w/ glasses Counseling: UTD on colonoscopy (done 2017, due 2027), UTD on prevnar.  Due for flu today. Care team reviewed and updated w/ pt Labs Ordered: See A&P Care Plan: See A&P    Review of Systems Patient reports no vision/hearing changes, anorexia, fever ,adenopathy, persistant/recurrent hoarseness, swallowing issues, chest pain, palpitations, edema, persistant/recurrent cough, hemoptysis, dyspnea (rest,exertional, paroxysmal nocturnal), gastrointestinal  bleeding (melena, rectal bleeding), abdominal pain, excessive heart burn, GU symptoms (dysuria, hematuria, voiding/incontinence issues) syncope, focal weakness, memory loss, hair/nail changes, depression, anxiety, abnormal bruising/bleeding, musculoskeletal symptoms/signs.   + carpal tunnel bilaterally + blisters on hands- seeing Derm    Objective:   Physical Exam BP 118/72   Pulse (!) 59   Temp 98.5 F (36.9 C) (Oral)   Resp 16   Ht 6\' 1"  (1.854 m)   Wt 216 lb 8 oz (98.2 kg)   SpO2 97%   BMI 28.56 kg/m   General Appearance:    Alert, cooperative, no distress, appears stated age  Head:    Normocephalic, without obvious abnormality, atraumatic  Eyes:    PERRL, conjunctiva/corneas clear, EOM's intact, fundi    benign, both eyes       Ears:    Normal TM's and external ear canals, both ears  Nose:   Nares normal, septum midline, mucosa  normal, no drainage   or sinus tenderness  Throat:   Lips, mucosa, and tongue normal; teeth and gums normal  Neck:   Supple, symmetrical, trachea midline, no adenopathy;       thyroid:  No enlargement/tenderness/nodules  Back:     Symmetric, no curvature, ROM normal, no CVA tenderness  Lungs:     Clear to auscultation bilaterally, respirations unlabored  Chest wall:    No tenderness or deformity  Heart:    Regular rate and rhythm, S1 and S2 normal, no murmur, rub   or gallop  Abdomen:     Soft, non-tender, bowel sounds active all four quadrants,    no masses, no organomegaly  Genitalia:    Normal male without lesion, discharge or tenderness  Rectal:    Normal tone, normal prostate, no masses or tenderness  Extremities:   Extremities normal, atraumatic, no cyanosis or edema  Pulses:   2+ and symmetric all extremities  Skin:   Skin color, texture, turgor normal, no rashes.  Resolving blisters/lesions of dorsum of hands bilaterally  Lymph nodes:   Cervical, supraclavicular, and axillary nodes normal  Neurologic:   CNII-XII intact. Normal strength, sensation and reflexes      throughout          Assessment & Plan:

## 2016-09-19 NOTE — Progress Notes (Signed)
Pre visit review using our clinic review tool, if applicable. No additional management support is needed unless otherwise documented below in the visit note. 

## 2016-09-19 NOTE — Patient Instructions (Signed)
Follow up in 1 year or as needed We'll notify you of your lab results and make any changes if needed Continue to work on healthy diet and regular exercise- you look great! You are not due for another colonoscopy until 2027-yay!!! You are up to date on Prevnar and you got your flu shot today Call with any questions or concerns Happy Fall!!!

## 2016-09-19 NOTE — Assessment & Plan Note (Signed)
Pt's PE WNL.  UTD on colonoscopy, immunizations.  Flu shot given today.  Written screening schedule updated and given to pt.  Check labs.  Anticipatory guidance provided.

## 2016-09-20 LAB — HEPATITIS C ANTIBODY: HCV Ab: NEGATIVE

## 2016-09-30 ENCOUNTER — Encounter: Payer: Self-pay | Admitting: Family Medicine

## 2016-09-30 MED ORDER — CYCLOBENZAPRINE HCL 10 MG PO TABS: 10.0000 mg | ORAL_TABLET | Freq: Three times a day (TID) | ORAL | 0 refills | Status: DC | PRN

## 2016-09-30 MED ORDER — PREDNISONE 10 MG PO TABS: ORAL_TABLET | ORAL | 0 refills | Status: DC

## 2016-10-12 ENCOUNTER — Encounter: Payer: Self-pay | Admitting: Family Medicine

## 2017-01-06 ENCOUNTER — Other Ambulatory Visit: Payer: Self-pay | Admitting: Family Medicine

## 2017-01-06 ENCOUNTER — Encounter: Payer: Self-pay | Admitting: Family Medicine

## 2017-01-06 DIAGNOSIS — T148XXA Other injury of unspecified body region, initial encounter: Secondary | ICD-10-CM

## 2017-01-07 DIAGNOSIS — R21 Rash and other nonspecific skin eruption: Secondary | ICD-10-CM | POA: Diagnosis not present

## 2017-01-07 DIAGNOSIS — L309 Dermatitis, unspecified: Secondary | ICD-10-CM | POA: Diagnosis not present

## 2017-01-10 DIAGNOSIS — L309 Dermatitis, unspecified: Secondary | ICD-10-CM | POA: Diagnosis not present

## 2017-01-10 DIAGNOSIS — L139 Bullous disorder, unspecified: Secondary | ICD-10-CM | POA: Diagnosis not present

## 2017-01-28 DIAGNOSIS — E8581 Light chain (AL) amyloidosis: Secondary | ICD-10-CM | POA: Diagnosis not present

## 2017-02-11 ENCOUNTER — Encounter: Payer: Self-pay | Admitting: Family Medicine

## 2017-03-18 DIAGNOSIS — M19011 Primary osteoarthritis, right shoulder: Secondary | ICD-10-CM | POA: Diagnosis not present

## 2017-03-18 DIAGNOSIS — M25511 Pain in right shoulder: Secondary | ICD-10-CM | POA: Diagnosis not present

## 2017-03-25 DIAGNOSIS — L570 Actinic keratosis: Secondary | ICD-10-CM | POA: Diagnosis not present

## 2017-03-25 DIAGNOSIS — Z08 Encounter for follow-up examination after completed treatment for malignant neoplasm: Secondary | ICD-10-CM | POA: Diagnosis not present

## 2017-03-25 DIAGNOSIS — Z85828 Personal history of other malignant neoplasm of skin: Secondary | ICD-10-CM | POA: Diagnosis not present

## 2017-03-25 DIAGNOSIS — L820 Inflamed seborrheic keratosis: Secondary | ICD-10-CM | POA: Diagnosis not present

## 2017-04-01 DIAGNOSIS — M216X1 Other acquired deformities of right foot: Secondary | ICD-10-CM | POA: Diagnosis not present

## 2017-04-01 DIAGNOSIS — M7671 Peroneal tendinitis, right leg: Secondary | ICD-10-CM | POA: Diagnosis not present

## 2017-04-11 DIAGNOSIS — M7671 Peroneal tendinitis, right leg: Secondary | ICD-10-CM | POA: Diagnosis not present

## 2017-06-20 ENCOUNTER — Encounter: Payer: Self-pay | Admitting: Family Medicine

## 2017-06-25 ENCOUNTER — Encounter: Payer: Self-pay | Admitting: Family Medicine

## 2017-06-25 ENCOUNTER — Ambulatory Visit (INDEPENDENT_AMBULATORY_CARE_PROVIDER_SITE_OTHER): Payer: Medicare Other | Admitting: Family Medicine

## 2017-06-25 VITALS — BP 118/70 | HR 60 | Temp 98.1°F | Resp 16 | Ht 73.0 in | Wt 216.1 lb

## 2017-06-25 DIAGNOSIS — R600 Localized edema: Secondary | ICD-10-CM

## 2017-06-25 LAB — CBC WITH DIFFERENTIAL/PLATELET
BASOS PCT: 1.2 % (ref 0.0–3.0)
Basophils Absolute: 0.1 10*3/uL (ref 0.0–0.1)
EOS PCT: 3.7 % (ref 0.0–5.0)
Eosinophils Absolute: 0.2 10*3/uL (ref 0.0–0.7)
HEMATOCRIT: 41.1 % (ref 39.0–52.0)
HEMOGLOBIN: 13.4 g/dL (ref 13.0–17.0)
LYMPHS PCT: 32.2 % (ref 12.0–46.0)
Lymphs Abs: 1.7 10*3/uL (ref 0.7–4.0)
MCHC: 32.7 g/dL (ref 30.0–36.0)
MCV: 94.9 fl (ref 78.0–100.0)
MONO ABS: 0.5 10*3/uL (ref 0.1–1.0)
MONOS PCT: 8.4 % (ref 3.0–12.0)
Neutro Abs: 3 10*3/uL (ref 1.4–7.7)
Neutrophils Relative %: 54.5 % (ref 43.0–77.0)
Platelets: 229 10*3/uL (ref 150.0–400.0)
RBC: 4.33 Mil/uL (ref 4.22–5.81)
RDW: 13.1 % (ref 11.5–15.5)
WBC: 5.4 10*3/uL (ref 4.0–10.5)

## 2017-06-25 LAB — BASIC METABOLIC PANEL
BUN: 15 mg/dL (ref 6–23)
CALCIUM: 9.1 mg/dL (ref 8.4–10.5)
CHLORIDE: 106 meq/L (ref 96–112)
CO2: 27 mEq/L (ref 19–32)
CREATININE: 1.11 mg/dL (ref 0.40–1.50)
GFR: 69.51 mL/min (ref >60.00)
Glucose, Bld: 91 mg/dL (ref 70–99)
Potassium: 4.3 mEq/L (ref 3.5–5.1)
Sodium: 141 mEq/L (ref 135–145)

## 2017-06-25 LAB — HEPATIC FUNCTION PANEL
ALBUMIN: 3.9 g/dL (ref 3.5–5.2)
ALT: 20 U/L (ref 0–53)
AST: 20 U/L (ref 0–37)
Alkaline Phosphatase: 90 U/L (ref 39–117)
BILIRUBIN TOTAL: 0.8 mg/dL (ref 0.2–1.2)
Bilirubin, Direct: 0.2 mg/dL (ref 0.0–0.3)
Total Protein: 5.9 g/dL — ABNORMAL LOW (ref 6.0–8.3)

## 2017-06-25 LAB — TSH: TSH: 3.25 u[IU]/mL (ref 0.35–4.50)

## 2017-06-25 NOTE — Progress Notes (Signed)
Pre visit review using our clinic review tool, if applicable. No additional management support is needed unless otherwise documented below in the visit note. 

## 2017-06-25 NOTE — Progress Notes (Signed)
   Subjective:    Patient ID: Bobby Harper, male    DOB: 1946/09/12, 71 y.o.   MRN: [id number]  HPI Ankle swelling- pt had 'significant soreness in R ankle' and saw [facility].  Was dx'd w/ tendonitis of peroneal tendons.  Was told to ice and brace- soreness and ROM improved but no improvement in swelling as day goes on.  Pt reports no added salt and not eating out very often.  No change in water intake- 8-10 glasses daily.  sxs just developed since weather turned hot and humid.  Ankles are normal size in the AM.  No CP, SOB.   Review of Systems For ROS see HPI     Objective:   Physical Exam  Constitutional: He is oriented to person, place, and time. He appears well-developed and well-nourished. No distress.  HENT:  Head: Normocephalic and atraumatic.  Eyes: Pupils are equal, round, and reactive to light. Conjunctivae and EOM are normal.  Neck: Normal range of motion. Neck supple. No thyromegaly present.  Cardiovascular: Normal rate, regular rhythm, normal heart sounds and intact distal pulses.   No murmur heard. Pulmonary/Chest: Effort normal and breath sounds normal. No respiratory distress.  Abdominal: Soft. Bowel sounds are normal. He exhibits no distension.  Musculoskeletal: He exhibits no edema or tenderness.  Lymphadenopathy:    He has no cervical adenopathy.  Neurological: He is alert and oriented to person, place, and time. No cranial nerve deficit.  Skin: Skin is warm and dry.  Psychiatric: He has a normal mood and affect. His behavior is normal.          Assessment & Plan:  LE edema- new.  No evidence of swelling on exam today.  No sxs of CHF by report and no evidence on PE.  Suspect this is multifactorial- heat, salt intake, recent ankle injury.  Check labs to r/o metabolic cause of swelling.  Reviewed supportive care and red flags that should prompt return.  Pt expressed understanding and is in agreement w/ plan.

## 2017-06-25 NOTE — Patient Instructions (Signed)
Follow up as scheduled or as needed We'll notify you of your lab results and make any changes if needed Drink plenty of water! Limit your salt intake Elevate your legs as needed It is common for legs to swell when it is hot and humid Call with any questions or concerns Hang in there! Have a great summer!!!

## 2017-07-29 ENCOUNTER — Encounter: Payer: Self-pay | Admitting: Family Medicine

## 2017-07-29 DIAGNOSIS — R600 Localized edema: Secondary | ICD-10-CM

## 2017-08-05 ENCOUNTER — Ambulatory Visit (HOSPITAL_COMMUNITY): Payer: Medicare Other | Attending: Cardiology

## 2017-08-05 ENCOUNTER — Other Ambulatory Visit: Payer: Self-pay

## 2017-08-05 ENCOUNTER — Encounter: Payer: Self-pay | Admitting: Family Medicine

## 2017-08-05 DIAGNOSIS — I071 Rheumatic tricuspid insufficiency: Secondary | ICD-10-CM | POA: Insufficient documentation

## 2017-08-05 DIAGNOSIS — D649 Anemia, unspecified: Secondary | ICD-10-CM | POA: Diagnosis not present

## 2017-08-05 DIAGNOSIS — I517 Cardiomegaly: Secondary | ICD-10-CM | POA: Insufficient documentation

## 2017-08-05 DIAGNOSIS — R600 Localized edema: Secondary | ICD-10-CM

## 2017-08-06 ENCOUNTER — Encounter: Payer: Self-pay | Admitting: Family Medicine

## 2017-08-06 ENCOUNTER — Other Ambulatory Visit: Payer: Self-pay | Admitting: Family Medicine

## 2017-08-06 DIAGNOSIS — I43 Cardiomyopathy in diseases classified elsewhere: Principal | ICD-10-CM

## 2017-08-06 DIAGNOSIS — E854 Organ-limited amyloidosis: Secondary | ICD-10-CM

## 2017-08-07 ENCOUNTER — Encounter: Payer: Self-pay | Admitting: Cardiology

## 2017-08-07 ENCOUNTER — Ambulatory Visit (INDEPENDENT_AMBULATORY_CARE_PROVIDER_SITE_OTHER): Payer: Medicare Other | Admitting: Cardiology

## 2017-08-07 DIAGNOSIS — I517 Cardiomegaly: Secondary | ICD-10-CM

## 2017-08-07 DIAGNOSIS — I509 Heart failure, unspecified: Secondary | ICD-10-CM | POA: Insufficient documentation

## 2017-08-07 DIAGNOSIS — R0609 Other forms of dyspnea: Secondary | ICD-10-CM

## 2017-08-07 DIAGNOSIS — R0600 Dyspnea, unspecified: Secondary | ICD-10-CM | POA: Insufficient documentation

## 2017-08-07 DIAGNOSIS — I5032 Chronic diastolic (congestive) heart failure: Secondary | ICD-10-CM

## 2017-08-07 NOTE — Progress Notes (Signed)
Cardiology Consultation:    Date:  08/07/2017   ID:  Bobby Harper, DOB [birth year], MRN [id number]  PCP:  Midge Minium, MD  Cardiologist:  Jenne Campus, MD   Referring MD: Midge Minium, MD   Chief Complaint  Patient presents with  . cardiac amyloidosis    Had recent echo, only complaint is Edema in the legs, feet and ankles  To discuss echocardiogram  History of Present Illness:    Bobby Harper is a 71 y.o. male who is being seen today for the evaluation of Swollen legs at the request of Tabori, Aundra Millet, MD. Patient is a 71 years old gentleman who well to his primary care physician a few weeks ago. Chief complaint at that time was progressive swelling of lower extremities ankles especially at evening time. That visit from echocardiogram which was done. Echocardiogram showed preserved left ventricular ejection fraction, there was significant degree of left ventricle hypertrophy, there was also significant diastolic dysfunction with pseudo-normal pattern. There is some suspicion for amyloidosis. Since that time he was told to drink more fluids and initially his legs swelled up more but now things are getting better. At the moment of my interview he doesn't have any swelling. He denies having any chest pain tightness squeezing pressure burning chest no palpitations no dizziness no passing out. Overall all his life he was very athletic and he exercised on the radial basis. Lately he plays golf on the radial basis and again he did not some increased shortness of breath while playing. He was no snoring at night.  Past Medical History:  Diagnosis Date  . Arthritis 06-23-13   osteoarthritis right hip/some left shoulder pain  . BPH (benign prostatic hyperplasia)   . Right hip pain    FOLLOWS DR. FOR ARTHRITIS    Past Surgical History:  Procedure Laterality Date  . REFRACTIVE SURGERY    . TOTAL HIP ARTHROPLASTY Right 06/29/2013   Procedure: RIGHT TOTAL HIP  ARTHROPLASTY ANTERIOR APPROACH;  Surgeon: Mauri Pole, MD;  Location: [facility];  Service: Orthopedics;  Laterality: Right;  Marland Kitchen VASECTOMY      Current Medications: Current Meds  Medication Sig  . aspirin EC 81 MG tablet Take 81 mg by mouth daily.  Marland Kitchen b complex vitamins tablet Take 1 tablet by mouth daily.  . Multiple Vitamins-Minerals (VITAMIN D3 COMPLETE PO) Take 1 tablet by mouth daily.  . Omega-3 Fatty Acids (FISH OIL) 1000 MG CAPS Take 1 capsule by mouth daily.     Allergies:   Sulfonamide derivatives   Social History   Social History  . Marital status: Married    Spouse name: N/A  . Number of children: N/A  . Years of education: N/A   Social History Main Topics  . Smoking status: Never Smoker  . Smokeless tobacco: Never Used  . Alcohol use Yes     Comment: weekends  . Drug use: No  . Sexual activity: Yes   Other Topics Concern  . None   Social History Narrative  . None     Family History: The patient's family history includes Coronary artery disease in his mother; Heart attack in his father; Heart disease (age of onset: 58) in his father; Hypertension in his mother; Kidney failure in his mother. ROS:   Please see the history of present illness.    All 14 point review of systems negative except as described per history of present illness.  EKGs/Labs/Other Studies Reviewed:    The  following studies were reviewed today: Echocardiogram which being described below   Recent Labs: 06/25/2017: ALT 20; BUN 15; Creatinine, Ser 1.11; Hemoglobin 13.4; Platelets 229.0; Potassium 4.3; Sodium 141; TSH 3.25  Recent Lipid Panel    Component Value Date/Time   CHOL 182 09/19/2016 1145   TRIG 85.0 09/19/2016 1145   TRIG 63 12/15/2006 1325   HDL 59.40 09/19/2016 1145   CHOLHDL 3 09/19/2016 1145   VLDL 17.0 09/19/2016 1145   LDLCALC 106 (H) 09/19/2016 1145   LDLDIRECT 128.6 08/10/2010 0913    Physical Exam:    VS:  BP 140/72   Pulse 80   Resp 12   Ht 6\' 1"  (1.854 m)    Wt 218 lb (98.9 kg)   BMI 28.76 kg/m     Wt Readings from Last 3 Encounters:  08/07/17 218 lb (98.9 kg)  06/25/17 216 lb 2 oz (98 kg)  09/19/16 216 lb 8 oz (98.2 kg)     GEN:  Well nourished, well developed in no acute distress HEENT: Normal NECK: JVD is elevated; No carotid bruits LYMPHATICS: No lymphadenopathy CARDIAC: RRR, no murmurs, no rubs, no gallops RESPIRATORY:  Clear to auscultation without rales, wheezing or rhonchi  ABDOMEN: Soft, non-tender, non-distended, there is hepatomegaly. MUSCULOSKELETAL:  No edema; No deformity  SKIN: Warm and dry NEUROLOGIC:  Alert and oriented x 3 PSYCHIATRIC:  Normal affect   ASSESSMENT:    1. LVH (left ventricular hypertrophy)   2. Chronic diastolic congestive heart failure ([place])    PLAN:    In order of problems listed above:  1. Unexplained left ventricle hypertrophy with some suspicious for amyloidosis. We'll schedule him to have no blurred tests to see if there is any uptake in his heart that was indicated amyloidosis. He does not have any signs and symptoms of amyloidosis But this is just being afforded him for this problem. 2. Chronic diastolic congestive heart failure with pseudo-normal pattern on transmitral flow of echocardiogram: Hemodynamically he appears to be fine he does have JVD and liver enlargement. I will ask him to have proBNP done and after that I will give him some diuretic. 3. Mild elevation of pulmonary artery pressure with right ventricular enlargement. He does not snore at night he said however in the future we may be forced to do sleep study to rule out sleep apnea as potential explanation for this problem. 4. Blood pressure was mildly elevated today but this is a first visit in our office.  Gentleman with echocardiographic findings of unexplained left ventricle hypertrophy and signs and symptoms of right sided heart failure. We'll do initial workup for amyloidosis, I check his proBNP and start treating with  diuretic.   Medication Adjustments/Labs and Tests Ordered: Current medicines are reviewed at length with the patient today.  Concerns regarding medicines are outlined above.  Orders Placed This Encounter  Procedures  . NM WBC SCAN TUMOR LOC LIMITED  . Pro b natriuretic peptide (BNP)   No orders of the defined types were placed in this encounter.   Signed, Park Liter, MD, [facility]. 08/07/2017 12:17 PM    [facility]

## 2017-08-07 NOTE — Patient Instructions (Addendum)
Medication Instructions:  Your physician recommends that you continue on your current medications as directed. Please refer to the Current Medication list given to you today.  Labwork: Your physician recommends that you have lab work today to check for fluid build up.   Testing/Procedures: Dr. Agustin Cree has ordered a scan to test for amyloidosis. Someone will reach out to you regarding this appointment.   Follow-Up: Your physician recommends that you schedule a follow-up appointment in: 2-3 weeks.   Any Other Special Instructions Will Be Listed Below (If Applicable).  Please note that any paperwork needing to be filled out by the provider will need to be addressed at the front desk prior to seeing the provider. Please note that any paperwork FMLA, Disability or other documents regarding health condition is subject to a $25.00 charge that must be received prior to completion of paperwork in the form of a money order or check.    If you need a refill on your cardiac medications before your next appointment, please call your pharmacy.

## 2017-08-08 ENCOUNTER — Telehealth: Payer: Self-pay

## 2017-08-08 DIAGNOSIS — Z79899 Other long term (current) drug therapy: Secondary | ICD-10-CM

## 2017-08-08 LAB — PRO B NATRIURETIC PEPTIDE: NT-Pro BNP: 2046 pg/mL — ABNORMAL HIGH (ref 0–376)

## 2017-08-08 MED ORDER — FUROSEMIDE 20 MG PO TABS: 20.0000 mg | ORAL_TABLET | Freq: Every day | ORAL | 6 refills | Status: DC

## 2017-08-08 MED ORDER — POTASSIUM CHLORIDE ER 10 MEQ PO TBCR: 10.0000 meq | EXTENDED_RELEASE_TABLET | Freq: Every day | ORAL | 6 refills | Status: DC

## 2017-08-08 NOTE — Addendum Note (Signed)
Addended by: Kathyrn Sheriff on: 08/08/2017 01:47 PM   Modules accepted: Orders

## 2017-08-08 NOTE — Telephone Encounter (Signed)
Left message for pt advising of him appointment for testing at [facility] Radiology Dept on Monday, September 10 @ 11:00. I have asked him to call back and confirm the appointment date and time.

## 2017-08-08 NOTE — Telephone Encounter (Signed)
S/w pt and discussed lab work and med start. Pt verbalized understanding.

## 2017-08-12 ENCOUNTER — Other Ambulatory Visit: Payer: Self-pay | Admitting: Cardiology

## 2017-08-18 ENCOUNTER — Ambulatory Visit (HOSPITAL_COMMUNITY)
Admission: RE | Admit: 2017-08-18 | Discharge: 2017-08-18 | Disposition: A | Discharge status: Home / Self Care | Payer: Medicare Other | Source: Ambulatory Visit | Attending: Cardiology | Admitting: Cardiology

## 2017-08-18 ENCOUNTER — Encounter (HOSPITAL_COMMUNITY)
Admission: RE | Admit: 2017-08-18 | Discharge: 2017-08-18 | Disposition: A | Discharge status: Home / Self Care | Payer: Medicare Other | Source: Ambulatory Visit | Attending: Cardiology | Admitting: Cardiology

## 2017-08-18 DIAGNOSIS — E859 Amyloidosis, unspecified: Secondary | ICD-10-CM | POA: Insufficient documentation

## 2017-08-18 DIAGNOSIS — I517 Cardiomegaly: Secondary | ICD-10-CM | POA: Diagnosis present

## 2017-08-18 DIAGNOSIS — I509 Heart failure, unspecified: Secondary | ICD-10-CM | POA: Diagnosis not present

## 2017-08-19 DIAGNOSIS — Z79899 Other long term (current) drug therapy: Secondary | ICD-10-CM | POA: Diagnosis not present

## 2017-08-19 NOTE — Addendum Note (Signed)
Addended by: Kathyrn Sheriff on: 08/19/2017 09:13 AM   Modules accepted: Orders

## 2017-08-20 LAB — BASIC METABOLIC PANEL
BUN / CREAT RATIO: 11 (ref 10–24)
BUN: 13 mg/dL (ref 8–27)
CALCIUM: 9.6 mg/dL (ref 8.6–10.2)
CHLORIDE: 105 mmol/L (ref 96–106)
CO2: 25 mmol/L (ref 20–29)
Creatinine, Ser: 1.14 mg/dL (ref 0.76–1.27)
GFR calc non Af Amer: 65 mL/min/{1.73_m2} (ref >59)
GFR, EST AFRICAN AMERICAN: 75 mL/min/{1.73_m2} (ref >59)
Glucose: 83 mg/dL (ref 65–99)
POTASSIUM: 4.3 mmol/L (ref 3.5–5.2)
SODIUM: 145 mmol/L — AB (ref 134–144)

## 2017-08-20 LAB — BRAIN NATRIURETIC PEPTIDE: BNP: 491.5 pg/mL — ABNORMAL HIGH (ref 0.0–100.0)

## 2017-08-21 ENCOUNTER — Encounter: Payer: Self-pay | Admitting: Cardiology

## 2017-08-23 ENCOUNTER — Encounter: Payer: Self-pay | Admitting: Family Medicine

## 2017-08-23 ENCOUNTER — Encounter: Payer: Self-pay | Admitting: Cardiology

## 2017-08-28 ENCOUNTER — Encounter: Payer: Self-pay | Admitting: Cardiology

## 2017-08-28 ENCOUNTER — Ambulatory Visit (INDEPENDENT_AMBULATORY_CARE_PROVIDER_SITE_OTHER): Payer: Medicare Other | Admitting: Cardiology

## 2017-08-28 VITALS — BP 118/70 | HR 76 | Resp 14 | Ht 73.0 in | Wt 215.8 lb

## 2017-08-28 DIAGNOSIS — R0609 Other forms of dyspnea: Secondary | ICD-10-CM

## 2017-08-28 DIAGNOSIS — I5032 Chronic diastolic (congestive) heart failure: Secondary | ICD-10-CM | POA: Diagnosis not present

## 2017-08-28 DIAGNOSIS — R0600 Dyspnea, unspecified: Secondary | ICD-10-CM

## 2017-08-28 DIAGNOSIS — I517 Cardiomegaly: Secondary | ICD-10-CM | POA: Diagnosis not present

## 2017-08-28 NOTE — Progress Notes (Signed)
Cardiology Office Note:    Date:  08/28/2017   ID:  PERSEUS WESTALL, DOB 01-Apr-1946, MRN [id number]  PCP:  Midge Minium, MD  Cardiologist:  Jenne Campus, MD    Referring MD: Midge Minium, MD   Chief Complaint  Patient presents with  . 3 week follow up  feeling better  History of Present Illness:    Bobby Harper is a 71 y.o. male  With swelling of lower extremities. He does have diagnosis of diastolic congestive heart failure. There was also some suspicion for amyloidosis. So far he did have no blurred tests try to see if there are deposits of amyloid in his heart results show equivocal uptake.He's feeling better swelling of lower extremities decreased oh is practically gone, does not have much exertional shortness of breath no palpitations. We talked in length about potential diagnosis of amyloidosis. He may require fact biopsy, I will talk today toadvise congestive heart failure team and we'll try to create a course of action in terms of trying to clarify the diagnosis. Luckily clinically he is doing well in about of fact he stopped diuretic because he started having some leg pain at evening time he was trying to go to sleep.  Past Medical History:  Diagnosis Date  . Arthritis 06-23-13   osteoarthritis right hip/some left shoulder pain  . BPH (benign prostatic hyperplasia)   . Right hip pain    FOLLOWS DR. FOR ARTHRITIS    Past Surgical History:  Procedure Laterality Date  . REFRACTIVE SURGERY    . TOTAL HIP ARTHROPLASTY Right 06/29/2013   Procedure: RIGHT TOTAL HIP ARTHROPLASTY ANTERIOR APPROACH;  Surgeon: Mauri Pole, MD;  Location: [facility];  Service: Orthopedics;  Laterality: Right;  Marland Kitchen VASECTOMY      Current Medications: Current Meds  Medication Sig  . aspirin EC 81 MG tablet Take 81 mg by mouth daily.  Marland Kitchen b complex vitamins tablet Take 1 tablet by mouth daily.  . Multiple Vitamins-Minerals (VITAMIN D3 COMPLETE PO) Take 1 tablet by mouth daily.  .  Omega-3 Fatty Acids (FISH OIL) 1000 MG CAPS Take 1 capsule by mouth daily.  . potassium chloride (K-DUR) 10 MEQ tablet Take 1 tablet (10 mEq total) by mouth daily.     Allergies:   Sulfonamide derivatives   Social History   Social History  . Marital status: Married    Spouse name: N/A  . Number of children: N/A  . Years of education: N/A   Social History Main Topics  . Smoking status: Never Smoker  . Smokeless tobacco: Never Used  . Alcohol use Yes     Comment: weekends  . Drug use: No  . Sexual activity: Yes   Other Topics Concern  . None   Social History Narrative  . None     Family History: The patient's family history includes Coronary artery disease in his mother; Heart attack in his father; Heart disease (age of onset: 33) in his father; Hypertension in his mother; Kidney failure in his mother. ROS:   Please see the history of present illness.    All 14 point review of systems negative except as described per history of present illness  EKGs/Labs/Other Studies Reviewed:      Recent Labs: 06/25/2017: ALT 20; Hemoglobin 13.4; Platelets 229.0; TSH 3.25 08/07/2017: NT-Pro BNP 2,046 08/19/2017: BNP 491.5; BUN 13; Creatinine, Ser 1.14; Potassium 4.3; Sodium 145  Recent Lipid Panel    Component Value Date/Time   CHOL 182 09/19/2016  1145   TRIG 85.0 09/19/2016 1145   TRIG 63 12/15/2006 1325   HDL 59.40 09/19/2016 1145   CHOLHDL 3 09/19/2016 1145   VLDL 17.0 09/19/2016 1145   LDLCALC 106 (H) 09/19/2016 1145   LDLDIRECT 128.6 08/10/2010 0913    Physical Exam:    VS:  BP 118/70   Pulse 76   Resp 14   Ht 6\' 1"  (1.854 m)   Wt 215 lb 12.8 oz (97.9 kg)   BMI 28.47 kg/m     Wt Readings from Last 3 Encounters:  08/28/17 215 lb 12.8 oz (97.9 kg)  08/07/17 218 lb (98.9 kg)  06/25/17 216 lb 2 oz (98 kg)     GEN:  Well nourished, well developed in no acute distress HEENT: Normal NECK: No JVD; No carotid bruits LYMPHATICS: No lymphadenopathy CARDIAC: RRR, no  murmurs, no rubs, no gallops RESPIRATORY:  Clear to auscultation without rales, wheezing or rhonchi  ABDOMEN: Soft, non-tender, non-distended MUSCULOSKELETAL:  No edema; No deformity  SKIN: Warm and dry LOWER EXTREMITIES: no swelling NEUROLOGIC:  Alert and oriented x 3 PSYCHIATRIC:  Normal affect   ASSESSMENT:    1. LVH (left ventricular hypertrophy)   2. Chronic diastolic congestive heart failure ([place])   3. Dyspnea on exertion    PLAN:    In order of problems listed above:  1. Left ventricle hypertrophy so far unexplained sofar testing from a low-dose is negative but he may require the fat biopsy. 2. Congestive heart failure: He appears to be hemodynamic stable. He stopped furosemide, stop potassium and does not want to take it because of some side effect that he develop from that medication he saidsince she stopped those medications he is [place] much better this does not have pain anymore. I told him he needs to weight himself every single day and if he see his weight increases by 2 pounds in 2 consecutive days he need to take some diuretic. 3. Dyspnea on exertion: Improved with current therapy.   Medication Adjustments/Labs and Tests Ordered: Current medicines are reviewed at length with the patient today.  Concerns regarding medicines are outlined above.  Orders Placed This Encounter  Procedures  . EKG 12-Lead   Medication changes: No orders of the defined types were placed in this encounter.   Signed, Park Liter, MD, [facility] 08/28/2017 2:07 PM    [place]

## 2017-08-28 NOTE — Patient Instructions (Signed)
Medication Instructions:  Your physician recommends that you continue on your current medications as directed. Please refer to the Current Medication list given to you today.  Labwork: None   Testing/Procedures: None   Follow-Up: Your physician recommends that you schedule a follow-up appointment in: 3 weeks   Any Other Special Instructions Will Be Listed Below (If Applicable).  Please note that any paperwork needing to be filled out by the provider will need to be addressed at the front desk prior to seeing the provider. Please note that any paperwork FMLA, Disability or other documents regarding health condition is subject to a $25.00 charge that must be received prior to completion of paperwork in the form of a money order or check.    If you need a refill on your cardiac medications before your next appointment, please call your pharmacy.

## 2017-09-02 ENCOUNTER — Encounter: Payer: Self-pay | Admitting: Family Medicine

## 2017-09-02 ENCOUNTER — Encounter: Payer: Self-pay | Admitting: Cardiology

## 2017-09-02 DIAGNOSIS — M79652 Pain in left thigh: Secondary | ICD-10-CM

## 2017-09-02 DIAGNOSIS — M79671 Pain in right foot: Secondary | ICD-10-CM

## 2017-09-02 DIAGNOSIS — M79606 Pain in leg, unspecified: Secondary | ICD-10-CM

## 2017-09-02 DIAGNOSIS — M79672 Pain in left foot: Principal | ICD-10-CM

## 2017-09-02 DIAGNOSIS — I517 Cardiomegaly: Secondary | ICD-10-CM

## 2017-09-02 DIAGNOSIS — M79651 Pain in right thigh: Secondary | ICD-10-CM

## 2017-09-03 ENCOUNTER — Telehealth: Payer: Self-pay | Admitting: Cardiology

## 2017-09-03 ENCOUNTER — Ambulatory Visit (INDEPENDENT_AMBULATORY_CARE_PROVIDER_SITE_OTHER): Payer: Medicare Other | Admitting: Family Medicine

## 2017-09-03 ENCOUNTER — Encounter: Payer: Self-pay | Admitting: Family Medicine

## 2017-09-03 VITALS — BP 121/81 | HR 76 | Temp 98.4°F | Resp 16 | Ht 73.0 in | Wt 217.5 lb

## 2017-09-03 DIAGNOSIS — M791 Myalgia, unspecified site: Secondary | ICD-10-CM

## 2017-09-03 DIAGNOSIS — G5793 Unspecified mononeuropathy of bilateral lower limbs: Secondary | ICD-10-CM | POA: Diagnosis not present

## 2017-09-03 LAB — CBC WITH DIFFERENTIAL/PLATELET
BASOS PCT: 1.2 % (ref 0.0–3.0)
Basophils Absolute: 0.1 10*3/uL (ref 0.0–0.1)
EOS ABS: 0.1 10*3/uL (ref 0.0–0.7)
Eosinophils Relative: 1.7 % (ref 0.0–5.0)
HCT: 41.6 % (ref 39.0–52.0)
Hemoglobin: 13.7 g/dL (ref 13.0–17.0)
Lymphocytes Relative: 39.4 % (ref 12.0–46.0)
Lymphs Abs: 2.1 10*3/uL (ref 0.7–4.0)
MCHC: 32.9 g/dL (ref 30.0–36.0)
MCV: 94 fl (ref 78.0–100.0)
MONO ABS: 0.4 10*3/uL (ref 0.1–1.0)
Monocytes Relative: 7.8 % (ref 3.0–12.0)
NEUTROS PCT: 49.9 % (ref 43.0–77.0)
Neutro Abs: 2.6 10*3/uL (ref 1.4–7.7)
PLATELETS: 198 10*3/uL (ref 150.0–400.0)
RBC: 4.43 Mil/uL (ref 4.22–5.81)
RDW: 13.5 % (ref 11.5–15.5)
WBC: 5.2 10*3/uL (ref 4.0–10.5)

## 2017-09-03 LAB — HEPATIC FUNCTION PANEL
ALT: 35 U/L (ref 0–53)
AST: 28 U/L (ref 0–37)
Albumin: 4 g/dL (ref 3.5–5.2)
Alkaline Phosphatase: 87 U/L (ref 39–117)
BILIRUBIN DIRECT: 0.2 mg/dL (ref 0.0–0.3)
Total Bilirubin: 0.9 mg/dL (ref 0.2–1.2)
Total Protein: 6 g/dL (ref 6.0–8.3)

## 2017-09-03 LAB — B12 AND FOLATE PANEL: VITAMIN B 12: 422 pg/mL (ref 211–911)

## 2017-09-03 LAB — TSH: TSH: 3.62 u[IU]/mL (ref 0.35–4.50)

## 2017-09-03 LAB — SEDIMENTATION RATE: Sed Rate: 3 mm/hr (ref 0–20)

## 2017-09-03 LAB — CK: Total CK: 242 U/L — ABNORMAL HIGH (ref 7–232)

## 2017-09-03 NOTE — Progress Notes (Signed)
Pre visit review using our clinic review tool, if applicable. No additional management support is needed unless otherwise documented below in the visit note. 

## 2017-09-03 NOTE — Telephone Encounter (Signed)
lmtrc to discuss.

## 2017-09-03 NOTE — Telephone Encounter (Signed)
Has been conversing with you thru email but decided it would be best to have a conversation over the phone

## 2017-09-03 NOTE — Patient Instructions (Signed)
Follow up as scheduled We'll notify you of your lab results and make any changes if needed If all the labs look normal, we'll refer you to neurology for further evaluation Continue to work on healthy diet and regular exercise- this will only help you Call with any questions or concerns Hang in there!!!

## 2017-09-03 NOTE — Progress Notes (Signed)
   Subjective:    Patient ID: Bobby Harper, male    DOB: 10/16/1946, 71 y.o.   MRN: [id number]  HPI Leg pain- pain occurs only when sitting or lying.  Improves w/ walking, exertion.  Pt describes the feeling as 'a lactic acid build up' in both thighs that feels like an ache.  Reports decreased endurance.  Pt reports a stinging pain in both feet, R>L.  Pt denies 'any ongoing back pain'.  Still able to lift and golf w/o difficulty.   Review of Systems For ROS see HPI     Objective:   Physical Exam  Constitutional: He is oriented to person, place, and time. He appears well-developed and well-nourished. No distress.  HENT:  Head: Normocephalic and atraumatic.  Cardiovascular: Intact distal pulses.   Musculoskeletal: He exhibits no edema, tenderness or deformity.  Neurological: He is alert and oriented to person, place, and time. He has normal reflexes. No cranial nerve deficit. Coordination normal.  Sensation intact bilaterally  Skin: Skin is warm and dry. No rash noted. No erythema.  Psychiatric: He has a normal mood and affect. His behavior is normal. Thought content normal.  Vitals reviewed.         Assessment & Plan:  Neuropathy- pt is describing neuropathic pain in his feet bilaterally, R>L.  He describes a stinging that occurs when sitting or lying.  Check labs to r/o underlying metabolic cause.  If labs are WNL, will refer to neurology for the next step as there is a question of Amyloidosis.  Will follow closely.  Myalgias- pt describing thigh pain bilaterally that occurs w/ rest but not activity.  He is not on a statin that could possibly cause this.  Check labs to assess CK, r/o dehydration, thyroid abnormalities, etc.  Encouraged increased hydration.  Discussed possibility of a back issue since he is having bilateral thigh and foot pain.  If labs and neuro workup are unrevealing, will refer to back specialist.  Pt expressed understanding and is in agreement w/ plan.

## 2017-09-04 ENCOUNTER — Other Ambulatory Visit: Payer: Self-pay | Admitting: Family Medicine

## 2017-09-04 ENCOUNTER — Encounter: Payer: Self-pay | Admitting: Family Medicine

## 2017-09-04 DIAGNOSIS — I517 Cardiomegaly: Secondary | ICD-10-CM | POA: Diagnosis not present

## 2017-09-04 DIAGNOSIS — G5793 Unspecified mononeuropathy of bilateral lower limbs: Secondary | ICD-10-CM

## 2017-09-04 LAB — B. BURGDORFI ANTIBODIES: B burgdorferi Ab IgG+IgM: <0.9 index

## 2017-09-04 LAB — ANA: Anti Nuclear Antibody(ANA): NEGATIVE

## 2017-09-05 ENCOUNTER — Encounter: Payer: Self-pay | Admitting: Neurology

## 2017-09-05 ENCOUNTER — Other Ambulatory Visit: Payer: Self-pay | Admitting: General Surgery

## 2017-09-05 ENCOUNTER — Other Ambulatory Visit: Payer: Self-pay | Admitting: Radiology

## 2017-09-05 ENCOUNTER — Encounter: Payer: Self-pay | Admitting: Cardiology

## 2017-09-08 ENCOUNTER — Encounter (HOSPITAL_COMMUNITY): Payer: Self-pay

## 2017-09-08 ENCOUNTER — Ambulatory Visit (HOSPITAL_COMMUNITY)
Admission: RE | Admit: 2017-09-08 | Discharge: 2017-09-08 | Disposition: A | Discharge status: Home / Self Care | Payer: Medicare Other | Source: Ambulatory Visit | Attending: Cardiology | Admitting: Cardiology

## 2017-09-08 DIAGNOSIS — M79671 Pain in right foot: Secondary | ICD-10-CM | POA: Diagnosis not present

## 2017-09-08 DIAGNOSIS — I517 Cardiomegaly: Secondary | ICD-10-CM | POA: Diagnosis not present

## 2017-09-08 DIAGNOSIS — Z96641 Presence of right artificial hip joint: Secondary | ICD-10-CM | POA: Insufficient documentation

## 2017-09-08 DIAGNOSIS — N400 Enlarged prostate without lower urinary tract symptoms: Secondary | ICD-10-CM | POA: Insufficient documentation

## 2017-09-08 DIAGNOSIS — C9110 Chronic lymphocytic leukemia of B-cell type not having achieved remission: Secondary | ICD-10-CM | POA: Diagnosis not present

## 2017-09-08 DIAGNOSIS — E65 Localized adiposity: Secondary | ICD-10-CM | POA: Insufficient documentation

## 2017-09-08 DIAGNOSIS — I5032 Chronic diastolic (congestive) heart failure: Secondary | ICD-10-CM | POA: Insufficient documentation

## 2017-09-08 DIAGNOSIS — Z79899 Other long term (current) drug therapy: Secondary | ICD-10-CM | POA: Insufficient documentation

## 2017-09-08 DIAGNOSIS — Z7982 Long term (current) use of aspirin: Secondary | ICD-10-CM | POA: Insufficient documentation

## 2017-09-08 DIAGNOSIS — Z882 Allergy status to sulfonamides status: Secondary | ICD-10-CM | POA: Insufficient documentation

## 2017-09-08 DIAGNOSIS — M1611 Unilateral primary osteoarthritis, right hip: Secondary | ICD-10-CM | POA: Diagnosis not present

## 2017-09-08 DIAGNOSIS — R222 Localized swelling, mass and lump, trunk: Secondary | ICD-10-CM | POA: Diagnosis not present

## 2017-09-08 LAB — CBC
HCT: 37.8 % — ABNORMAL LOW (ref 39.0–52.0)
Hemoglobin: 12.6 g/dL — ABNORMAL LOW (ref 13.0–17.0)
MCH: 30.1 pg (ref 26.0–34.0)
MCHC: 33.3 g/dL (ref 30.0–36.0)
MCV: 90.2 fL (ref 78.0–100.0)
PLATELETS: 182 10*3/uL (ref 150–400)
RBC: 4.19 MIL/uL — ABNORMAL LOW (ref 4.22–5.81)
RDW: 13.3 % (ref 11.5–15.5)
WBC: 6 10*3/uL (ref 4.0–10.5)

## 2017-09-08 LAB — PROTIME-INR
INR: 1.1
Prothrombin Time: 14.2 seconds (ref 11.4–15.2)

## 2017-09-08 MED ORDER — FENTANYL CITRATE (PF) 100 MCG/2ML IJ SOLN
INTRAMUSCULAR | Status: DC
Start: 2017-09-08 — End: 2017-09-09
  Filled 2017-09-08: qty 2

## 2017-09-08 MED ORDER — FENTANYL CITRATE (PF) 100 MCG/2ML IJ SOLN
INTRAMUSCULAR | Status: AC | PRN
  Administered 2017-09-08: 50 ug via INTRAVENOUS
  Administered 2017-09-08 (×2): 25 ug via INTRAVENOUS

## 2017-09-08 MED ORDER — LIDOCAINE HCL (PF) 1 % IJ SOLN
INTRAMUSCULAR | Status: AC
  Filled 2017-09-08: qty 10

## 2017-09-08 MED ORDER — SODIUM CHLORIDE 0.9 % IV SOLN: INTRAVENOUS | Status: DC

## 2017-09-08 MED ORDER — MIDAZOLAM HCL 2 MG/2ML IJ SOLN
INTRAMUSCULAR | Status: AC
  Filled 2017-09-08: qty 2

## 2017-09-08 MED ORDER — MIDAZOLAM HCL 2 MG/2ML IJ SOLN
INTRAMUSCULAR | Status: AC | PRN
  Administered 2017-09-08 (×2): 0.5 mg via INTRAVENOUS
  Administered 2017-09-08: 1 mg via INTRAVENOUS

## 2017-09-08 MED ORDER — SODIUM CHLORIDE 0.9 % IV SOLN
INTRAVENOUS | Status: AC | PRN
  Administered 2017-09-08: 10 mL/h via INTRAVENOUS

## 2017-09-08 NOTE — H&P (Signed)
Chief Complaint: Patient was seen in consultation today for fat pad biopsy at the request of Park Liter  Referring Physician(s): Park Liter  Supervising Physician: Marybelle Killings  Patient Status: [facility] - Out-pt  History of Present Illness: Bobby Harper is a 71 y.o. male   Left ventricular hypertophy-- yet unexplained 1. Left ventricle hypertrophy so far unexplained sofar testing from a low-dose is negative but he may require the fat biopsy.  Complained of lower extremity edema---now resolved Known diastolic congestive heart failure Suspicious for amyloidosis  NM study 9/10: IMPRESSION: Visual and quantitative assessment (grade 1, H/CLL equal 1.10) are equivocal for transthyretin amyloidosis.  Now scheduled for fat pad biopsy  Past Medical History:  Diagnosis Date  . Arthritis 06-23-13   osteoarthritis right hip/some left shoulder pain  . BPH (benign prostatic hyperplasia)   . Right hip pain    FOLLOWS DR. FOR ARTHRITIS    Past Surgical History:  Procedure Laterality Date  . REFRACTIVE SURGERY    . TOTAL HIP ARTHROPLASTY Right 06/29/2013   Procedure: RIGHT TOTAL HIP ARTHROPLASTY ANTERIOR APPROACH;  Surgeon: Mauri Pole, MD;  Location: [facility];  Service: Orthopedics;  Laterality: Right;  Marland Kitchen VASECTOMY      Allergies: Sulfonamide derivatives  Medications: Prior to Admission medications   Medication Sig Start Date End Date Taking? Authorizing Provider  aspirin EC 81 MG tablet Take 81 mg by mouth daily.   Yes [provider]  b complex vitamins tablet Take 1 tablet by mouth daily.   Yes [provider]  cholecalciferol (VITAMIN D) 1000 units tablet Take 1,000 Units by mouth daily.   Yes [provider]  furosemide (LASIX) 20 MG tablet Take 1 tablet (20 mg total) by mouth daily. Patient taking differently: Take 20 mg by mouth daily as needed for edema.  08/08/17 11/06/17 Yes Park Liter, MD  Omega-3 Fatty Acids  (FISH OIL) 1000 MG CAPS Take 1 capsule by mouth daily.   Yes [provider]  potassium chloride (K-DUR) 10 MEQ tablet Take 1 tablet (10 mEq total) by mouth daily. Patient taking differently: Take 10 mEq by mouth daily as needed (Takes with Lasix).  08/08/17 11/06/17 Yes Park Liter, MD     Family History  Problem Relation Age of Onset  . Coronary artery disease Mother   . Hypertension Mother   . Kidney failure Mother   . Heart attack Father   . Heart disease Father 57       MI    Social History   Social History  . Marital status: Married    Spouse name: N/A  . Number of children: N/A  . Years of education: N/A   Social History Main Topics  . Smoking status: Never Smoker  . Smokeless tobacco: Never Used  . Alcohol use Yes     Comment: weekends  . Drug use: No  . Sexual activity: Yes   Other Topics Concern  . None   Social History Narrative  . None    Review of Systems: A 12 point ROS discussed and pertinent positives are indicated in the HPI above.  All other systems are negative.  Review of Systems  Constitutional: Negative for activity change, fatigue and fever.  Respiratory: Negative for cough and shortness of breath.   Cardiovascular: Negative for chest pain.  Gastrointestinal: Negative for abdominal pain.  Neurological: Negative for weakness.  Psychiatric/Behavioral: Negative for behavioral problems and confusion.    Vital Signs: BP 139/76 (BP Location:  Right Arm)   Pulse 81   Temp 98.2 F (36.8 C) (Oral)   Ht 6\' 1"  (1.854 m)   Wt 218 lb (98.9 kg)   SpO2 94%   BMI 28.76 kg/m   Physical Exam  Cardiovascular: Normal rate, regular rhythm and normal heart sounds.   Pulmonary/Chest: Effort normal.  Abdominal: Soft. Bowel sounds are normal.  Musculoskeletal: Normal range of motion.  Neurological: He is alert.  Skin: Skin is warm and dry.  Psychiatric: He has a normal mood and affect. His behavior is normal. Judgment and thought content  normal.  Nursing note and vitals reviewed.   Imaging: Nm Tumor Localization W Spect  Result Date: 08/18/2017 CLINICAL DATA:  HEART FAILURE. CONCERN FOR CARDIAC AMYLOIDOSIS. EXAM: NUCLEAR MEDICINE TUMOR LOCALIZATION. PYP CARDIAC AMYLOIDOSIS SCAN WITH SPECT TECHNIQUE: Following intravenous administration of radiopharmaceutical, anterior planar images of the chest were obtained. Regions of interest were placed on the heart and contralateral chest wall for quantitative assessment. Additional SPECT imaging of the chest was obtained. RADIOPHARMACEUTICALS:  21.5 mCi TECHNETIUM 99 PYROPHOSPHATE FINDINGS: Planar Visual assessment: Anterior planar imaging demonstrates radiotracer uptake within the heart less than uptake within the adjacent ribs (Grade 1). Quantitative assessment : Quantitative assessment of the cardiac uptake compared to the contralateral chest wall is equal to 1.10. SPECT assessment: SPECT imaging of the chest demonstrates clear radiotracer accumulation within the LEFT ventricle. IMPRESSION: Visual and quantitative assessment (grade 1, H/CLL equal 1.10) are equivocal for transthyretin amyloidosis. Electronically Signed   By: Kerby Moors M.D.   On: 08/18/2017 16:19    Labs:  CBC:  Recent Labs  09/19/16 1145 06/25/17 0900 09/03/17 1125  WBC 5.1 5.4 5.2  HGB 14.5 13.4 13.7  HCT 42.8 41.1 41.6  PLT 218.0 229.0 198.0    COAGS: No results for input(s): INR, APTT in the last 8760 hours.  BMP:  Recent Labs  09/19/16 1145 06/25/17 0900 08/19/17 0933  NA 140 141 145*  K 4.5 4.3 4.3  CL 103 106 105  CO2 31 27 25   GLUCOSE 84 91 83  BUN 13 15 13   CALCIUM 9.5 9.1 9.6  CREATININE 1.14 1.11 1.14  GFRNONAA  --   --  65  GFRAA  --   --  75    LIVER FUNCTION TESTS:  Recent Labs  09/19/16 1145 06/25/17 0900 09/03/17 1125 09/04/17 1132  BILITOT 0.8 0.8 0.9  --   AST 22 20 28   --   ALT 20 20 35  --   ALKPHOS 72 90 87  --   PROT 7.0 5.9* 6.0 6.1  ALBUMIN 4.6 3.9 4.0   --     TUMOR MARKERS: No results for input(s): AFPTM, CEA, CA199, CHROMGRNA in the last 8760 hours.  Assessment and Plan:  LVH Diastolic CHF Suspicious for amyloidosis Scheduled for fat pad biopsy Risks and benefits discussed with the patient including, but not limited to bleeding, infection, damage to adjacent structures or low yield requiring additional tests. All of the patient's questions were answered, patient is agreeable to proceed. Consent signed and in chart.  Thank you for this interesting consult.  I greatly enjoyed meeting PRINCETON NABOR and look forward to participating in their care.  A copy of this report was sent to the requesting provider on this date.  Electronically Signed: Lavonia Drafts, PA-C 09/08/2017, 12:12 PM   I spent a total of  30 Minutes   in face to face in clinical consultation, greater than 50% of which was  counseling/coordinating care for fat pad biopsy

## 2017-09-08 NOTE — Procedures (Signed)
Fat pad Bx (abd) 16 g core times 5 EBL 0 Comp 0

## 2017-09-08 NOTE — Discharge Instructions (Addendum)
Moderate Conscious Sedation, Adult, Care After  These instructions provide you with information about caring for yourself after your procedure. Your health care provider may also give you more specific instructions. Your treatment has been planned according to current medical practices, but problems sometimes occur. Call your health care provider if you have any problems or questions after your procedure.  What can I expect after the procedure?  After your procedure, it is common:  · To feel sleepy for several hours.  · To feel clumsy and have poor balance for several hours.  · To have poor judgment for several hours.  · To vomit if you eat too soon.    Follow these instructions at home:  For at least 24 hours after the procedure:    · Do not:  ? Participate in activities where you could fall or become injured.  ? Drive.  ? Use heavy machinery.  ? Drink alcohol.  ? Take sleeping pills or medicines that cause drowsiness.  ? Make important decisions or sign legal documents.  ? Take care of children on your own.  · Rest.  Eating and drinking  · Follow the diet recommended by your health care provider.  · If you vomit:  ? Drink water, juice, or soup when you can drink without vomiting.  ? Make sure you have little or no nausea before eating solid foods.  General instructions  · Have a responsible adult stay with you until you are awake and alert.  · Take over-the-counter and prescription medicines only as told by your health care provider.  · If you smoke, do not smoke without supervision.  · Keep all follow-up visits as told by your health care provider. This is important.  Contact a health care provider if:  · You keep feeling nauseous or you keep vomiting.  · You feel light-headed.  · You develop a rash.  · You have a fever.  Get help right away if:  · You have trouble breathing.  This information is not intended to replace advice given to you by your health care provider. Make sure you discuss any questions you have  with your health care provider.  Document Released: 09/15/2013 Document Revised: 04/29/2016 Document Reviewed: 03/16/2016  Elsevier Interactive Patient Education © 2018 [employer]  Needle Biopsy, Care After  Refer to this sheet in the next few weeks. These instructions provide you with information about caring for yourself after your procedure. Your health care provider may also give you more specific instructions. Your treatment has been planned according to current medical practices, but problems sometimes occur. Call your health care provider if you have any problems or questions after your procedure.  What can I expect after the procedure?  After your procedure, it is common to have soreness, bruising, or mild pain at the biopsy site. This should go away in a few days.  Follow these instructions at home:  · Rest as directed by your health care provider.  · Take medicines only as directed by your health care provider.  · There are many different ways to close and cover the biopsy site, including stitches (sutures), skin glue, and adhesive strips. Follow your health care provider's instructions about:  ? Biopsy site care.  ? Bandage (dressing) changes and removal.  ? Biopsy site closure removal.  · Check your biopsy site every day for signs of infection. Watch for:  ? Redness, swelling, or pain.  ? Fluid, blood, or pus.  Contact a health   care provider if:  · You have a fever.  · You have redness, swelling, or pain at the biopsy site that lasts longer than a few days.  · You have fluid, blood, or pus coming from the biopsy site.  · You feel nauseous.  · You vomit.  Get help right away if:  · You have shortness of breath.  · You have trouble breathing.  · You have chest pain.  · You feel dizzy or you faint.  · You have bleeding that does not stop with pressure or a bandage.  · You cough up blood.  · You have pain in your abdomen.  This information is not intended to replace advice given to you by your health care  provider. Make sure you discuss any questions you have with your health care provider.  Document Released: 04/11/2015 Document Revised: 05/02/2016 Document Reviewed: 11/21/2014  Elsevier Interactive Patient Education © 2018 [employer]

## 2017-09-09 ENCOUNTER — Encounter: Payer: Self-pay | Admitting: Family Medicine

## 2017-09-09 LAB — PROTEIN ELECTROPHORESIS, SERUM
A/G RATIO SPE: 1.3 (ref 0.7–1.7)
ALBUMIN ELP: 3.4 g/dL (ref 2.9–4.4)
Alpha 1: 0.3 g/dL (ref 0.0–0.4)
Alpha 2: 0.8 g/dL (ref 0.4–1.0)
BETA: 1 g/dL (ref 0.7–1.3)
GAMMA GLOBULIN: 0.6 g/dL (ref 0.4–1.8)
Globulin, Total: 2.7 g/dL (ref 2.2–3.9)
TOTAL PROTEIN: 6.1 g/dL (ref 6.0–8.5)

## 2017-09-09 LAB — KAPPA/LAMBDA LIGHT CHAINS
IG KAPPA FREE LIGHT CHAIN: 13.6 mg/L (ref 3.3–19.4)
Ig Lambda Free Light Chain: 416 mg/L — ABNORMAL HIGH (ref 5.7–26.3)
Kappa/Lambda FluidC Ratio: 0.03 — ABNORMAL LOW (ref 0.26–1.65)

## 2017-09-10 ENCOUNTER — Encounter: Payer: Self-pay | Admitting: Cardiology

## 2017-09-11 ENCOUNTER — Encounter: Payer: Self-pay | Admitting: Cardiology

## 2017-09-12 ENCOUNTER — Encounter: Payer: Self-pay | Admitting: Family Medicine

## 2017-09-14 ENCOUNTER — Encounter: Payer: Self-pay | Admitting: Family Medicine

## 2017-09-15 LAB — UPEP/TP, 24-HR URINE
PROTEIN 24H UR: 1159 mg/(24.h) — AB (ref 30–150)
Protein, Ur: 45.9 mg/dL

## 2017-09-16 ENCOUNTER — Encounter: Payer: Self-pay | Admitting: Neurology

## 2017-09-16 ENCOUNTER — Other Ambulatory Visit: Payer: Self-pay | Admitting: Family Medicine

## 2017-09-16 MED ORDER — GABAPENTIN 300 MG PO CAPS: 300.0000 mg | ORAL_CAPSULE | Freq: Every day | ORAL | 3 refills | Status: DC

## 2017-09-16 NOTE — Progress Notes (Signed)
Prescription sent

## 2017-09-18 ENCOUNTER — Encounter: Payer: Self-pay | Admitting: Cardiology

## 2017-09-18 ENCOUNTER — Ambulatory Visit (INDEPENDENT_AMBULATORY_CARE_PROVIDER_SITE_OTHER): Payer: Medicare Other | Admitting: Cardiology

## 2017-09-18 VITALS — BP 120/64 | HR 68 | Resp 10 | Ht 73.0 in | Wt 214.8 lb

## 2017-09-18 DIAGNOSIS — I517 Cardiomegaly: Secondary | ICD-10-CM

## 2017-09-18 DIAGNOSIS — I5032 Chronic diastolic (congestive) heart failure: Secondary | ICD-10-CM | POA: Diagnosis not present

## 2017-09-18 DIAGNOSIS — R0609 Other forms of dyspnea: Secondary | ICD-10-CM

## 2017-09-18 DIAGNOSIS — R0600 Dyspnea, unspecified: Secondary | ICD-10-CM

## 2017-09-18 NOTE — Progress Notes (Signed)
Cardiology Office Note:    Date:  09/18/2017   ID:  Bobby Harper, DOB [birth year], MRN [id number]  PCP:  Midge Minium, MD  Cardiologist:  Jenne Campus, MD    Referring MD: Midge Minium, MD   Chief Complaint  Patient presents with  . 3 week follow up   feeling poorly  History of Present Illness:    Bobby Harper is a 71 y.o. male  with diastolic congestive heart failure. There is suspicion for amyloidosis. So far we have a lot of tests done many of them being counseled on insufficient biopsy came as negative but there is a comment about the tissue samples not being adequate. He did have a urine analysis done which showed significant proteinuria but electrophoresis was not done. Nuclear scan looking for amyloidosis was equivocal. A lot of information that are not conclusive. I think which the point that appointment with referral to tertiary care center as needed. Referral has been already made. He complained of neuropathy in lower extremities. No evidence of congestive heart failure today.  Past Medical History:  Diagnosis Date  . Arthritis 06-23-13   osteoarthritis right hip/some left shoulder pain  . BPH (benign prostatic hyperplasia)   . Right hip pain    FOLLOWS DR. FOR ARTHRITIS    Past Surgical History:  Procedure Laterality Date  . REFRACTIVE SURGERY    . TOTAL HIP ARTHROPLASTY Right 06/29/2013   Procedure: RIGHT TOTAL HIP ARTHROPLASTY ANTERIOR APPROACH;  Surgeon: Mauri Pole, MD;  Location: [facility];  Service: Orthopedics;  Laterality: Right;  Marland Kitchen VASECTOMY      Current Medications: Current Meds  Medication Sig  . aspirin EC 81 MG tablet Take 81 mg by mouth daily.  Marland Kitchen b complex vitamins tablet Take 1 tablet by mouth daily.  . cholecalciferol (VITAMIN D) 1000 units tablet Take 1,000 Units by mouth daily.  . furosemide (LASIX) 20 MG tablet Take 1 tablet (20 mg total) by mouth daily. (Patient taking differently: Take 20 mg by mouth daily as needed for  edema. )  . gabapentin (NEURONTIN) 300 MG capsule Take 1 capsule (300 mg total) by mouth at bedtime.  . Omega-3 Fatty Acids (FISH OIL) 1000 MG CAPS Take 1 capsule by mouth daily.  . potassium chloride (K-DUR) 10 MEQ tablet Take 1 tablet (10 mEq total) by mouth daily. (Patient taking differently: Take 10 mEq by mouth daily as needed (Takes with Lasix). )     Allergies:   Sulfonamide derivatives   Social History   Social History  . Marital status: Married    Spouse name: N/A  . Number of children: N/A  . Years of education: N/A   Social History Main Topics  . Smoking status: Never Smoker  . Smokeless tobacco: Never Used  . Alcohol use Yes     Comment: weekends  . Drug use: No  . Sexual activity: Yes   Other Topics Concern  . None   Social History Narrative  . None     Family History: The patient's family history includes Coronary artery disease in his mother; Heart attack in his father; Heart disease (age of onset: 8) in his father; Hypertension in his mother; Kidney failure in his mother. ROS:   Please see the history of present illness.    All 14 point review of systems negative except as described per history of present illness  EKGs/Labs/Other Studies Reviewed:      Recent Labs: 08/07/2017: NT-Pro BNP 2,046 08/19/2017: BNP  491.5; BUN 13; Creatinine, Ser 1.14; Potassium 4.3; Sodium 145 09/03/2017: ALT 35; TSH 3.62 09/08/2017: Hemoglobin 12.6; Platelets 182  Recent Lipid Panel    Component Value Date/Time   CHOL 182 09/19/2016 1145   TRIG 85.0 09/19/2016 1145   TRIG 63 12/15/2006 1325   HDL 59.40 09/19/2016 1145   CHOLHDL 3 09/19/2016 1145   VLDL 17.0 09/19/2016 1145   LDLCALC 106 (H) 09/19/2016 1145   LDLDIRECT 128.6 08/10/2010 0913    Physical Exam:    VS:  BP 120/64   Pulse 68   Resp 10   Ht 6\' 1"  (1.854 m)   Wt 214 lb 12.8 oz (97.4 kg)   BMI 28.34 kg/m     Wt Readings from Last 3 Encounters:  09/18/17 214 lb 12.8 oz (97.4 kg)  09/08/17 218 lb  (98.9 kg)  09/03/17 217 lb 8 oz (98.7 kg)     GEN:  Well nourished, well developed in no acute distress HEENT: Normal NECK: No JVD; No carotid bruits LYMPHATICS: No lymphadenopathy CARDIAC: RRR, no murmurs, no rubs, no gallops RESPIRATORY:  Clear to auscultation without rales, wheezing or rhonchi  ABDOMEN: Soft, non-tender, non-distended MUSCULOSKELETAL:  No edema; No deformity  SKIN: Warm and dry LOWER EXTREMITIES: no swelling NEUROLOGIC:  Alert and oriented x 3 PSYCHIATRIC:  Normal affect   ASSESSMENT:    1. Chronic diastolic congestive heart failure ([place])   2. LVH (left ventricular hypertrophy)   3. Dyspnea on exertion    PLAN:    In order of problems listed above:  1. Chronic diastolic congestive heart faint: Appears to be compensated, does not take any diuretics now. Their workup from amyloidosis is pending. Referral to tertiary care center has been made. 2. Left ventricular hypertrophy: This was suspicion for amyloidosis with low voltage EKG. 3. This very on exertion: Denies having any but he does not do much   Medication Adjustments/Labs and Tests Ordered: Current medicines are reviewed at length with the patient today.  Concerns regarding medicines are outlined above.  No orders of the defined types were placed in this encounter.  Medication changes: No orders of the defined types were placed in this encounter.   Signed, Park Liter, MD, [facility] 09/18/2017 8:40 AM    [place]

## 2017-09-18 NOTE — Patient Instructions (Signed)
Medication Instructions:  Your physician recommends that you continue on your current medications as directed. Please refer to the Current Medication list given to you today.  Labwork: None   Testing/Procedures: None   Follow-Up: Your physician recommends that you schedule a follow-up appointment in: 1 month   Any Other Special Instructions Will Be Listed Below (If Applicable).  Please note that any paperwork needing to be filled out by the provider will need to be addressed at the front desk prior to seeing the provider. Please note that any paperwork FMLA, Disability or other documents regarding health condition is subject to a $25.00 charge that must be received prior to completion of paperwork in the form of a money order or check.     If you need a refill on your cardiac medications before your next appointment, please call your pharmacy.  

## 2017-09-19 ENCOUNTER — Ambulatory Visit: Payer: Medicare Other | Admitting: Family Medicine

## 2017-09-20 ENCOUNTER — Encounter: Payer: Self-pay | Admitting: Cardiology

## 2017-09-22 ENCOUNTER — Ambulatory Visit (INDEPENDENT_AMBULATORY_CARE_PROVIDER_SITE_OTHER): Payer: Medicare Other | Admitting: Neurology

## 2017-09-22 ENCOUNTER — Other Ambulatory Visit (INDEPENDENT_AMBULATORY_CARE_PROVIDER_SITE_OTHER): Payer: Medicare Other

## 2017-09-22 ENCOUNTER — Encounter: Payer: Self-pay | Admitting: Neurology

## 2017-09-22 VITALS — BP 120/70 | HR 77 | Ht 73.0 in | Wt 218.4 lb

## 2017-09-22 DIAGNOSIS — Z131 Encounter for screening for diabetes mellitus: Secondary | ICD-10-CM

## 2017-09-22 DIAGNOSIS — R0609 Other forms of dyspnea: Secondary | ICD-10-CM | POA: Diagnosis not present

## 2017-09-22 DIAGNOSIS — I517 Cardiomegaly: Secondary | ICD-10-CM | POA: Diagnosis not present

## 2017-09-22 DIAGNOSIS — I5032 Chronic diastolic (congestive) heart failure: Secondary | ICD-10-CM | POA: Diagnosis not present

## 2017-09-22 DIAGNOSIS — G629 Polyneuropathy, unspecified: Secondary | ICD-10-CM

## 2017-09-22 LAB — HEMOGLOBIN A1C: Hgb A1c MFr Bld: 5.6 % (ref 4.6–6.5)

## 2017-09-22 NOTE — Patient Instructions (Addendum)
1.  Check labs 2.  Nerve conduction study/EMG of the legs 3.  Continue gabapentin 300mg  at bedtime  We will call you with the results of your testing

## 2017-09-22 NOTE — Progress Notes (Signed)
[facility] Clinic Note - Initial Visit   Date: 09/22/17  Bobby Harper MRN: [id number] DOB: 12/21/1945   Dear Dr. Birdie Riddle:  Thank you for your kind referral of Bobby Harper for consultation of paresthesias of the legs. Although his history is well known to you, please allow [place] to reiterate it for the purpose of our medical record. The patient was accompanied to the clinic by wife who also provides collateral information.     History of Present Illness: Bobby Harper is a 71 y.o. right-handed Caucasian male with diastolic congestive heart failure presenting for evaluation of paresthesias of the legs.    Starting in June 2018, he had tendonitis of the right ankle and saw [place] [facility] who diagnosed him with peroneal teonditis.  Over the summer, he gradually started having swelling of both ankles for which he underwent echocardiogram showing left ventricular hypertrophy and diastolic dysfunction, concerning for amyloidosis.  He had a fat pad biopsy which was negative for amyloidosis and has an appointment at [facility] this week in the amylodosis clinic for further work-up.  He started taking lasix around August for his swelling, which helped but then developed burning and sharp pain of the feet, worse over the toes.  Symptoms are worse at night.  Gabapentin 355m at bedtime has helped alleviate pain and allows him to sleep.  He denies any numbness or weakness of the feet.  He was diagnosed with bilateral carpal tunnel syndrome and has some tingling due to this in the hands, which is relieved with wrist splint.  He denies imbalance and walks unassisted.   He also complains of bilateral achy pain of the thighs and buttocks. It feels like he has "lactic acid buildup after working out".   Pain is improved with exercise and worse at rest.  He does not have difficulty climbing stairs or getting up out of a low chair. He has some relief with gabapentin.  He initially  had some benefit with tylenol.  He denies any similar achy pain of the arms.  He denies cramps, muscle twitches, or radicular pain.  Out-side paper records, electronic medical record, and images have been reviewed where available and summarized as:  Lab Results  Component Value Date   TSH 3.62 09/03/2017   Lab Results  Component Value Date   VTKPTWSFK81 27509/26/2018   Lab Results  Component Value Date   FOLATE >23.9 09/03/2017   Lab Results  Component Value Date   CTZGYFVC 944(H) 09/03/2017   Lab Results  Component Value Date   ANA NEGATIVE 09/03/2017   Lab Results  Component Value Date   ESRSEDRATE 3 09/03/2017     Past Medical History:  Diagnosis Date  . Arthritis 06-23-13   osteoarthritis right hip/some left shoulder pain  . BPH (benign prostatic hyperplasia)   . Right hip pain    FOLLOWS DR. FOR ARTHRITIS    Past Surgical History:  Procedure Laterality Date  . REFRACTIVE SURGERY    . TOTAL HIP ARTHROPLASTY Right 06/29/2013   Procedure: RIGHT TOTAL HIP ARTHROPLASTY ANTERIOR APPROACH;  Surgeon: MMauri Pole MD;  Location: [facility];  Service: Orthopedics;  Laterality: Right;  .Marland KitchenVASECTOMY       Medications:  Outpatient Encounter Prescriptions as of 09/22/2017  Medication Sig  . furosemide (LASIX) 20 MG tablet Take 1 tablet (20 mg total) by mouth daily. (Patient taking differently: Take 20 mg by mouth daily as needed for edema. )  . gabapentin (NEURONTIN)  300 MG capsule Take 1 capsule (300 mg total) by mouth at bedtime.  . potassium chloride (K-DUR) 10 MEQ tablet Take 1 tablet (10 mEq total) by mouth daily. (Patient taking differently: Take 10 mEq by mouth daily as needed (Takes with Lasix). )  . aspirin EC 81 MG tablet Take 81 mg by mouth daily.  Marland Kitchen b complex vitamins tablet Take 1 tablet by mouth daily.  . cholecalciferol (VITAMIN D) 1000 units tablet Take 1,000 Units by mouth daily.  . Omega-3 Fatty Acids (FISH OIL) 1000 MG CAPS Take 1 capsule by mouth daily.     No facility-administered encounter medications on file as of 09/22/2017.      Allergies:  Allergies  Allergen Reactions  . Sulfonamide Derivatives Other (See Comments)    Years ago, lips and gums broke out in blisters    Family History: Family History  Problem Relation Age of Onset  . Coronary artery disease Mother   . Hypertension Mother   . Kidney failure Mother   . Heart attack Father   . Heart disease Father 71       MI    Social History: Social History  Substance Use Topics  . Smoking status: Never Smoker  . Smokeless tobacco: Never Used  . Alcohol use Yes     Comment: weekends, socially   Social History   Social History Narrative   Lives with wife in a 2 story home.  Has 2 children.     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     Education: college.     Review of Systems:  CONSTITUTIONAL: No fevers, chills, night sweats, or weight loss.   EYES: No visual changes or eye pain ENT: No hearing changes.  No history of nose bleeds.   RESPIRATORY: No cough, wheezing and shortness of breath.   CARDIOVASCULAR: Negative for chest pain, and palpitations.   GI: Negative for abdominal discomfort, blood in stools or black stools.  No recent change in bowel habits.   GU:  No history of incontinence.   MUSCLOSKELETAL: No history of joint pain or swelling.  No myalgias.   SKIN: Negative for lesions, rash, and itching.   HEMATOLOGY/ONCOLOGY: Negative for prolonged bleeding, bruising easily, and swollen nodes.  No history of cancer.   ENDOCRINE: Negative for cold or heat intolerance, polydipsia or goiter.   PSYCH:  No depression or anxiety symptoms.   NEURO: As Above.   Vital Signs:  BP 120/70   Pulse 77   Ht _0  (1.854 m)   Wt 218 lb 6 oz (99.1 kg)   SpO2 97%   BMI 28.81 kg/m    General Medical Exam:   General:  Well appearing, comfortable.   Eyes/ENT: see cranial nerve examination.   Neck: No masses appreciated.  Full range of motion without  tenderness.  No carotid bruits. Respiratory:  Clear to auscultation, good air entry bilaterally.   Cardiac:  Regular rate and rhythm, no murmur.   Extremities:  No deformities, edema, or skin discoloration.  Skin:  No rashes or lesions.  Neurological Exam: MENTAL STATUS including orientation to time, place, person, recent and remote memory, attention span and concentration, language, and fund of knowledge is normal.  Speech is not dysarthric.  CRANIAL NERVES: II:  No visual field defects.  Unremarkable fundi.   III-IV-VI: Pupils equal round and reactive to light.  Normal conjugate, extra-ocular eye movements in all directions of gaze.  No nystagmus.  No ptosis.   V:  Normal facial sensation.    VII:  Normal facial symmetry and movements.   VIII:  Normal hearing and vestibular function.   IX-X:  Normal palatal movement.   XI:  Normal shoulder shrug and head rotation.   XII:  Normal tongue strength and range of motion, no deviation or fasciculation.  MOTOR:  No atrophy, fasciculations or abnormal movements.  No pronator drift.  Tone is normal.  There is no tenderness to muscle palpation.  Right Upper Extremity:    Left Upper Extremity:    Deltoid  5/5   Deltoid  5/5   Biceps  5/5   Biceps  5/5   Triceps  5/5   Triceps  5/5   Wrist extensors  5/5   Wrist extensors  5/5   Wrist flexors  5/5   Wrist flexors  5/5   Finger extensors  5/5   Finger extensors  5/5   Finger flexors  5/5   Finger flexors  5/5   Dorsal interossei  5/5   Dorsal interossei  5/5   Abductor pollicis  5/5   Abductor pollicis  5/5   Tone (Ashworth scale)  0  Tone (Ashworth scale)  0   Right Lower Extremity:    Left Lower Extremity:    Hip flexors  5/5   Hip flexors  5/5   Hip extensors  5/5   Hip extensors  5/5   Knee flexors  5/5   Knee flexors  5/5   Knee extensors  5/5   Knee extensors  5/5   Dorsiflexors  5/5   Dorsiflexors  5/5   Plantarflexors  5/5   Plantarflexors  5/5   Toe extensors  5/5   Toe extensors   5/5   Toe flexors  4/5   Toe flexors  4/5   Tone (Ashworth scale)  0  Tone (Ashworth scale)  0   MSRs:  Right                                                                 Left brachioradialis 2+  brachioradialis 2+  biceps 2+  biceps 2+  triceps 2+  triceps 2+  patellar 2+  patellar 2+  ankle jerk 0  ankle jerk 0  Hoffman no  Hoffman no  plantar response down  plantar response down   SENSORY:  Reduced vibration to 20% at the ankle and absent at the toes.  Light touch, pin prick, and temperature. Romberg's sign absent.   COORDINATION/GAIT: Normal finger-to- nose-finger.  Intact rapid alternating movements bilaterally.  Able to rise from a chair without using arms.  Gait narrow based and stable. Tandem and stressed gait intact.    IMPRESSION: Mr. Lovering is a 71 year-old man who is undergoing evaluation for possibly amyloidosis referred for evaluation of bilateral feet painful paresthesias which started during the summer of 2018.  His neurological examination shows a distal predominant large large fiber peripheral neuropathy. I had extensive discussion with the patient regarding the pathogenesis, etiology, management, and natural course of neuropathy.  NCS/EMG of the legs will be ordered to better characterize the nature of his pain and be sure there is no lumbosacral radiculopathy. Further, I will check for any proximal myopathic units which may guide muscle biopsy, if needed.  If there is neuropathy on his electrodiagnostic testing, I explained that it would not suggest amyloid and biopsy may be necessary, but would prefer this as last option given potential pain associated with nerve biopsies. He has already had extensive laboratory testing including ESR, ANA, TSH, vitamin B12, and folate which is normal. SPEP did not show monoclonal gammopathy and fat pad did not show amyloidosis. CK is very mildly elevated. I will check a few additional labs as noted below.   PLAN/RECOMMENDATIONS:  1.   NCS/EMG of the legs 2.  Check HbA1c, copper, heavy metal screen 3.  Continue gabapentin 323m at bedtime which adequately controls pain 4.  [facility]  The duration of this appointment visit was 50 minutes of face-to-face time with the patient.  Greater than 50% of this time was spent in counseling, explanation of diagnosis, planning of further management, and coordination of care.   Thank you for allowing me to participate in patient's care.  If I can answer any additional questions, I would be pleased to do so.    Sincerely,    Donika K. PPosey Pronto DO

## 2017-09-23 NOTE — Progress Notes (Signed)
Subjective:   Bobby Harper is a 71 y.o. male who presents for Medicare Annual/Subsequent preventive examination.  Review of Systems:  No ROS.  Medicare Wellness Visit. Additional risk factors are reflected in the social history.  Cardiac Risk Factors include: family history of premature cardiovascular disease;advanced age (>66men, >35 women);male gender   Sleep patterns: Sleeps 8 hours.  Home Safety/Smoke Alarms: Feels safe in home. Smoke alarms in place.  Living environment; residence and Firearm Safety: Lives with wife and adult child (CP) in 2 story home.   Seat Belt Safety/Bike Helmet: Wears seat belt.    Male:   [facility]-Colonoscopy 04/18/2016, normal. [facility].      PSA-  Lab Results  Component Value Date   PSA 0.19 09/19/2016   PSA 0.20 09/13/2015   PSA 0.15 09/08/2014       Objective:    Vitals: BP 128/70 (BP Location: Left Arm, Patient Position: Sitting, Cuff Size: Normal)   Pulse 78   Temp 98.2 F (36.8 C) (Temporal)   Resp 18   Ht 6\' 1"  (1.854 m)   Wt 212 lb 3.2 oz (96.3 kg)   SpO2 98%   BMI 28.00 kg/m   Body mass index is 28 kg/m.  Tobacco History  Smoking Status  . Never Smoker  Smokeless Tobacco  . Never Used     Counseling given: Not Answered   Past Medical History:  Diagnosis Date  . Arthritis 06-23-13   osteoarthritis right hip/some left shoulder pain  . BPH (benign prostatic hyperplasia)   . Right hip pain    FOLLOWS DR. FOR ARTHRITIS   Past Surgical History:  Procedure Laterality Date  . REFRACTIVE SURGERY    . TOTAL HIP ARTHROPLASTY Right 06/29/2013   Procedure: RIGHT TOTAL HIP ARTHROPLASTY ANTERIOR APPROACH;  Surgeon: Mauri Pole, MD;  Location: [facility];  Service: Orthopedics;  Laterality: Right;  Marland Kitchen VASECTOMY     Family History  Problem Relation Age of Onset  . Coronary artery disease Mother   . Hypertension Mother   . Kidney failure Mother   . Heart attack Father   . Heart disease Father 53       MI   History  Sexual  Activity  . Sexual activity: Yes    Outpatient Encounter Prescriptions as of 09/24/2017  Medication Sig  . aspirin EC 81 MG tablet Take 81 mg by mouth daily.  Marland Kitchen b complex vitamins tablet Take 1 tablet by mouth daily.  . cholecalciferol (VITAMIN D) 1000 units tablet Take 1,000 Units by mouth daily.  . furosemide (LASIX) 20 MG tablet Take 1 tablet (20 mg total) by mouth daily. (Patient taking differently: Take 20 mg by mouth daily as needed for edema. )  . gabapentin (NEURONTIN) 300 MG capsule Take 1 capsule (300 mg total) by mouth at bedtime.  . Omega-3 Fatty Acids (FISH OIL) 1000 MG CAPS Take 1 capsule by mouth daily.  . potassium chloride (K-DUR) 10 MEQ tablet Take 1 tablet (10 mEq total) by mouth daily. (Patient taking differently: Take 10 mEq by mouth daily as needed (Takes with Lasix). )   No facility-administered encounter medications on file as of 09/24/2017.     Activities of Daily Living In your present state of health, do you have any difficulty performing the following activities: 09/24/2017 09/08/2017  Hearing? N N  Vision? N N  Difficulty concentrating or making decisions? N N  Walking or climbing stairs? N N  Dressing or bathing? N N  Doing errands,  shopping? N -  Preparing Food and eating ? N -  Using the Toilet? N -  In the past six months, have you accidently leaked urine? N -  Do you have problems with loss of bowel control? N -  Managing your Medications? N -  Managing your Finances? N -  Housekeeping or managing your Housekeeping? N -  Some recent data might be hidden    Patient Care Team: Midge Minium, MD as PCP - General Wilford Corner, MD as Consulting Physician (Gastroenterology) Park Liter, MD as Consulting Physician (Cardiology) Dermatology, [facility] & (Dermatology) Alda Berthold, DO as Consulting Physician (Neurology)   Assessment:    Physical assessment deferred to PCP.  Exercise Activities and Dietary  recommendations Current Exercise Habits: Home exercise routine, Type of exercise: walking (golf), Time (Minutes): 45, Frequency (Times/Week): 7, Weekly Exercise (Minutes/Week): 315, Exercise limited by: None identified   Diet (meal preparation, eat out, water intake, caffeinated beverages, dairy products, fruits and vegetables): Drinks water, juice and occasional soda.   Eats heart healthy diet most days.       Goals      Patient Stated   . patient states (pt-stated)          Continue to be as active as possible.       Fall Risk Fall Risk  09/24/2017 09/22/2017 09/03/2017 09/19/2016 09/13/2015  Falls in the past year? No No No No No   Depression Screen PHQ 2/9 Scores 09/24/2017 09/03/2017 09/19/2016 09/13/2015  PHQ - 2 Score 0 0 0 0  PHQ- 9 Score - 0 - -    Cognitive Function       Ad8 score reviewed for issues:  Issues making decisions: no  Less interest in hobbies / activities: no  Repeats questions, stories (family complaining): no  Trouble using ordinary gadgets (microwave, computer, phone): no  Forgets the month or year: no  Mismanaging finances: no  Remembering appts: no  Daily problems with thinking and/or memory: no Ad8 score is=0     Immunization History  Administered Date(s) Administered  . Influenza, High Dose Seasonal PF 09/24/2017  . Influenza,inj,Quad PF,6+ Mos 09/13/2015, 09/19/2016  . Pneumococcal Conjugate-13 09/08/2014  . Pneumococcal Polysaccharide-23 07/30/2002, 09/24/2017  . Td 12/15/2006   Screening Tests Health Maintenance  Topic Date Due  . PNA vac Low Risk Adult (2 of 2 - PPSV23) 09/09/2015  . INFLUENZA VACCINE  03/09/2018 (Originally 07/09/2017)  . TETANUS/TDAP  09/08/2018 (Originally 12/15/2016)  . COLONOSCOPY  04/18/2026  . Hepatitis C Screening  Completed      Plan:    Bring a copy of your living will and/or healthcare power of attorney to your next office visit.  Continue doing brain stimulating activities (puzzles,  reading, adult coloring books, staying active) to keep memory sharp.    I have personally reviewed and noted the following in the patient's chart:   . Medical and social history . Use of alcohol, tobacco or illicit drugs  . Current medications and supplements . Functional ability and status . Nutritional status . Physical activity . Advanced directives . List of other physicians . Hospitalizations, surgeries, and ER visits in previous 12 months . Vitals . Screenings to include cognitive, depression, and falls . Referrals and appointments  In addition, I have reviewed and discussed with patient certain preventive protocols, quality metrics, and best practice recommendations. A written personalized care plan for preventive services as well as general preventive health recommendations were provided to patient.  Gerilyn Nestle, RN  09/24/2017  Reviewed documentation and agree w/ above.  Annye Asa, MD

## 2017-09-24 ENCOUNTER — Ambulatory Visit (INDEPENDENT_AMBULATORY_CARE_PROVIDER_SITE_OTHER): Payer: Medicare Other | Admitting: Family Medicine

## 2017-09-24 ENCOUNTER — Encounter: Payer: Self-pay | Admitting: Family Medicine

## 2017-09-24 ENCOUNTER — Ambulatory Visit: Payer: Medicare Other

## 2017-09-24 VITALS — BP 128/70 | HR 78 | Temp 98.2°F | Resp 18 | Ht 73.0 in | Wt 212.2 lb

## 2017-09-24 DIAGNOSIS — Z0000 Encounter for general adult medical examination without abnormal findings: Secondary | ICD-10-CM | POA: Diagnosis not present

## 2017-09-24 DIAGNOSIS — E663 Overweight: Secondary | ICD-10-CM

## 2017-09-24 DIAGNOSIS — Z23 Encounter for immunization: Secondary | ICD-10-CM | POA: Diagnosis not present

## 2017-09-24 DIAGNOSIS — Z125 Encounter for screening for malignant neoplasm of prostate: Secondary | ICD-10-CM

## 2017-09-24 LAB — UPEP/TP, 24-HR URINE
ALPHA 2 UR: 3.4 %
Albumin, U: 78.2 %
Alpha 1, Urine: 5.9 %
Beta, Urine: 9.8 %
Gamma Globulin, Urine: 2.8 %
PROTEIN 24H UR: 3948 mg/(24.h) — AB (ref 30–150)
PROTEIN UR: 157.9 mg/dL

## 2017-09-24 LAB — LIPID PANEL
Cholesterol: 186 mg/dL (ref 0–200)
HDL: 55.5 mg/dL (ref >39.00)
LDL CALC: 117 mg/dL — AB (ref 0–99)
NONHDL: 130.94
TRIGLYCERIDES: 69 mg/dL (ref 0.0–149.0)
Total CHOL/HDL Ratio: 3
VLDL: 13.8 mg/dL (ref 0.0–40.0)

## 2017-09-24 LAB — PSA, MEDICARE: PSA: 0.14 ng/ml (ref 0.10–4.00)

## 2017-09-24 NOTE — Assessment & Plan Note (Signed)
Pt's PE WNL.  UTD on colonoscopy.  Flu shot and Pneumovax updated today.  Check labs that have not been done recently (lipids, PSA).  Anticipatory guidance provided.

## 2017-09-24 NOTE — Progress Notes (Signed)
   Subjective:    Patient ID: Bobby [facility], male    DOB: May 07, 1946, 71 y.o.   MRN: [id number]  HPI CPE- UTD on colonoscopy, flu and Pneumovax given today.   Review of Systems Patient reports no vision/hearing changes, anorexia, fever ,adenopathy, persistant/recurrent hoarseness, swallowing issues, chest pain, palpitations, edema, persistant/recurrent cough, hemoptysis, dyspnea (rest,exertional, paroxysmal nocturnal), gastrointestinal  bleeding (melena, rectal bleeding), abdominal pain, excessive heart burn, GU symptoms (dysuria, hematuria, voiding/incontinence issues) syncope, focal weakness, memory loss, skin/hair/nail changes, depression, anxiety, abnormal bruising/bleeding, musculoskeletal symptoms/signs.   + neuropathy- seeing Neurology    Objective:   Physical Exam General Appearance:    Alert, cooperative, no distress, appears stated age  Head:    Normocephalic, without obvious abnormality, atraumatic  Eyes:    PERRL, conjunctiva/corneas clear, EOM's intact, fundi    benign, both eyes       Ears:    Normal TM's and external ear canals, both ears  Nose:   Nares normal, septum midline, mucosa normal, no drainage   or sinus tenderness  Throat:   Lips, mucosa, and tongue normal; teeth and gums normal  Neck:   Supple, symmetrical, trachea midline, no adenopathy;       thyroid:  No enlargement/tenderness/nodules  Back:     Symmetric, no curvature, ROM normal, no CVA tenderness  Lungs:     Clear to auscultation bilaterally, respirations unlabored  Chest wall:    No tenderness or deformity  Heart:    Regular rate and rhythm, S1 and S2 normal, no murmur, rub   or gallop  Abdomen:     Soft, non-tender, bowel sounds active all four quadrants,    no masses, no organomegaly  Genitalia:    Deferred at pt's request  Rectal:    Extremities:   Extremities normal, atraumatic, no cyanosis or edema  Pulses:   2+ and symmetric all extremities  Skin:   Skin color, texture, turgor normal, no  rashes or lesions  Lymph nodes:   Cervical, supraclavicular, and axillary nodes normal  Neurologic:   CNII-XII intact. Normal strength, sensation and reflexes      throughout          Assessment & Plan:

## 2017-09-24 NOTE — Patient Instructions (Addendum)
Follow up in 1 year or as needed We'll notify you of your lab results and make any changes if needed Continue to work on healthy diet and regular exercise- you look great! Call with any questions or concerns GOOD LUCK TOMORROW!!!  Bring a copy of your living will and/or healthcare power of attorney to your next office visit.  Continue doing brain stimulating activities (puzzles, reading, adult coloring books, staying active) to keep memory sharp.    Health Maintenance, Male A healthy lifestyle and preventive care is important for your health and wellness. Ask your health care provider about what schedule of regular examinations is right for you. What should I know about weight and diet? Eat a Healthy Diet  Eat plenty of vegetables, fruits, whole grains, low-fat dairy products, and lean protein.  Do not eat a lot of foods high in solid fats, added sugars, or salt.  Maintain a Healthy Weight Regular exercise can help you achieve or maintain a healthy weight. You should:  Do at least 150 minutes of exercise each week. The exercise should increase your heart rate and make you sweat (moderate-intensity exercise).  Do strength-training exercises at least twice a week.  Watch Your Levels of Cholesterol and Blood Lipids  Have your blood tested for lipids and cholesterol every 5 years starting at 70 years of age. If you are at high risk for heart disease, you should start having your blood tested when you are 70 years old. You may need to have your cholesterol levels checked more often if: ? Your lipid or cholesterol levels are high. ? You are older than 70 years of age. ? You are at high risk for heart disease.  What should I know about cancer screening? Many types of cancers can be detected early and may often be prevented. Lung Cancer  You should be screened every year for lung cancer if: ? You are a current smoker who has smoked for at least 30 years. ? You are a former smoker who  has quit within the past 15 years.  Talk to your health care provider about your screening options, when you should start screening, and how often you should be screened.  Colorectal Cancer  Routine colorectal cancer screening usually begins at 70 years of age and should be repeated every 5-10 years until you are 70 years old. You may need to be screened more often if early forms of precancerous polyps or small growths are found. Your health care provider may recommend screening at an earlier age if you have risk factors for colon cancer.  Your health care provider may recommend using home test kits to check for hidden blood in the stool.  A small camera at the end of a tube can be used to examine your colon (sigmoidoscopy or colonoscopy). This checks for the earliest forms of colorectal cancer.  Prostate and Testicular Cancer  Depending on your age and overall health, your health care provider may do certain tests to screen for prostate and testicular cancer.  Talk to your health care provider about any symptoms or concerns you have about testicular or prostate cancer.  Skin Cancer  Check your skin from head to toe regularly.  Tell your health care provider about any new moles or changes in moles, especially if: ? There is a change in a mole's size, shape, or color. ? You have a mole that is larger than a pencil eraser.  Always use sunscreen. Apply sunscreen liberally and repeat throughout the  day.  Protect yourself by wearing long sleeves, pants, a wide-brimmed hat, and sunglasses when outside.  What should I know about heart disease, diabetes, and high blood pressure?  If you are 59-5 years of age, have your blood pressure checked every 3-5 years. If you are 78 years of age or older, have your blood pressure checked every year. You should have your blood pressure measured twice-once when you are at a hospital or clinic, and once when you are not at a hospital or clinic. Record the  average of the two measurements. To check your blood pressure when you are not at a hospital or clinic, you can use: ? An automated blood pressure machine at a pharmacy. ? A home blood pressure monitor.  Talk to your health care provider about your target blood pressure.  If you are between 75-72 years old, ask your health care provider if you should take aspirin to prevent heart disease.  Have regular diabetes screenings by checking your fasting blood sugar level. ? If you are at a normal weight and have a low risk for diabetes, have this test once every three years after the age of 50. ? If you are overweight and have a high risk for diabetes, consider being tested at a younger age or more often.  A one-time screening for abdominal aortic aneurysm (AAA) by ultrasound is recommended for men aged 33-75 years who are current or former smokers. What should I know about preventing infection? Hepatitis B If you have a higher risk for hepatitis B, you should be screened for this virus. Talk with your health care provider to find out if you are at risk for hepatitis B infection. Hepatitis C Blood testing is recommended for:  Everyone born from [birth year] through 1964.  Anyone with known risk factors for hepatitis C.  Sexually Transmitted Diseases (STDs)  You should be screened each year for STDs including gonorrhea and chlamydia if: ? You are sexually active and are younger than 70 years of age. ? You are older than 70 years of age and your health care provider tells you that you are at risk for this type of infection. ? Your sexual activity has changed since you were last screened and you are at an increased risk for chlamydia or gonorrhea. Ask your health care provider if you are at risk.  Talk with your health care provider about whether you are at high risk of being infected with HIV. Your health care provider may recommend a prescription medicine to help prevent HIV infection.  What else can  I do?  Schedule regular health, dental, and eye exams.  Stay current with your vaccines (immunizations).  Do not use any tobacco products, such as cigarettes, chewing tobacco, and e-cigarettes. If you need help quitting, ask your health care provider.  Limit alcohol intake to no more than 2 drinks per day. One drink equals 12 ounces of beer, 5 ounces of wine, or 1 ounces of hard liquor.  Do not use street drugs.  Do not share needles.  Ask your health care provider for help if you need support or information about quitting drugs.  Tell your health care provider if you often feel depressed.  Tell your health care provider if you have ever been abused or do not feel safe at home. This information is not intended to replace advice given to you by your health care provider. Make sure you discuss any questions you have with your health care provider. Document Released:  05/23/2008 Document Revised: 07/24/2016 Document Reviewed: 08/29/2015 Elsevier Interactive Patient Education  [employer].

## 2017-09-24 NOTE — Assessment & Plan Note (Signed)
Chronic problem.  Stressed need for healthy diet and regular exercise.  Check labs to risk stratify.  Will follow. 

## 2017-09-25 ENCOUNTER — Encounter: Payer: Self-pay | Admitting: Neurology

## 2017-09-25 ENCOUNTER — Encounter: Payer: Self-pay | Admitting: General Practice

## 2017-09-25 DIAGNOSIS — E859 Amyloidosis, unspecified: Secondary | ICD-10-CM | POA: Diagnosis not present

## 2017-09-25 DIAGNOSIS — G629 Polyneuropathy, unspecified: Secondary | ICD-10-CM | POA: Diagnosis not present

## 2017-09-25 DIAGNOSIS — I43 Cardiomyopathy in diseases classified elsewhere: Secondary | ICD-10-CM | POA: Diagnosis not present

## 2017-09-25 DIAGNOSIS — E8809 Other disorders of plasma-protein metabolism, not elsewhere classified: Secondary | ICD-10-CM | POA: Diagnosis not present

## 2017-09-25 DIAGNOSIS — C9000 Multiple myeloma not having achieved remission: Secondary | ICD-10-CM | POA: Diagnosis not present

## 2017-09-25 DIAGNOSIS — E854 Organ-limited amyloidosis: Secondary | ICD-10-CM | POA: Diagnosis not present

## 2017-09-25 DIAGNOSIS — R809 Proteinuria, unspecified: Secondary | ICD-10-CM | POA: Diagnosis not present

## 2017-09-26 LAB — HEAVY METALS PANEL, BLOOD
LEAD: 1 ug/dL (ref <5)
Mercury, B: <5 mcg/L (ref 0–10)

## 2017-09-26 LAB — COPPER, SERUM: Copper: 131 ug/dL (ref 70–175)

## 2017-10-03 ENCOUNTER — Encounter: Payer: Self-pay | Admitting: Family Medicine

## 2017-10-07 ENCOUNTER — Ambulatory Visit (INDEPENDENT_AMBULATORY_CARE_PROVIDER_SITE_OTHER): Payer: Medicare Other | Admitting: Neurology

## 2017-10-07 DIAGNOSIS — G629 Polyneuropathy, unspecified: Secondary | ICD-10-CM

## 2017-10-07 DIAGNOSIS — M5417 Radiculopathy, lumbosacral region: Secondary | ICD-10-CM

## 2017-10-07 NOTE — Procedures (Signed)
[facility]  [address]  [place], [place] [address] Tel: [phone] Fax:  [phone] Test Date:  10/07/2017  Patient: Bobby Harper DOB: [birth year] Physician: Narda Amber, DO  Sex: Male Height: 6\' 1"  Ref Phys: Narda Amber, DO  ID#: [id number] Temp: 35.0C Technician:    Patient Complaints: This is a 71 year-old man referred for evaluation of bilateral leg paresthesias and weakness.  NCV & EMG Findings: Extensive electrodiagnostic testing of the right lower extremity and additional studies of the left shows:  1. Bilateral sural and superficial peroneal sensory responses are absent. 2. Bilateral peroneal motor responses at the extensor digitorum are reduced, and is normal at the tibialis anterior. Bilateral tibial motor response shows borderline slowed conduction velocity with normal latency and amplitude. 3. Bilateral tibial H reflex studies are absent. 4. Chronic motor axon loss changes are seen affecting the muscles below the knee bilaterally as well as right rectus femoris muscle. Active denervation isolated to the right medial gastrocnemius muscle only.  Impression: 1. The electrophysiologic findings are most consistent with a sensorimotor polyneuropathy, axon loss in type, affecting the lower extremities. 2. Superimposed chronic L4 radiculopathy affecting the right lower extremity.   ___________________________ Narda Amber, DO    Nerve Conduction Studies Anti Sensory Summary Table   Stim Site NR Peak (ms) Norm Peak (ms) P-T Amp (V) Norm P-T Amp  Left Sup Peroneal Anti Sensory (Ant Lat Mall)  35.8C  12 cm NR  <4.6  >3  Right Sup Peroneal Anti Sensory (Ant Lat Mall)  35.8C  12 cm NR  <4.6  >3  Left Sural Anti Sensory (Lat Mall)  35.8C  Calf NR  <4.6  >3  Right Sural Anti Sensory (Lat Mall)  35.8C  Calf NR  <4.6  >3   Motor Summary Table   Stim Site NR Onset (ms) Norm Onset (ms) O-P Amp (mV) Norm O-P Amp Site1 Site2 Delta-0 (ms)  Dist (cm) Vel (m/s) Norm Vel (m/s)  Left Peroneal Motor (Ext Dig Brev)  35.8C  Ankle    4.5 <6.0 1.2 >2.5 B Fib Ankle 8.5 40.0 47 >40  B Fib    13.0  1.0  Poplt B Fib 2.2 9.0 41 >40  Poplt    15.2  0.9         Right Peroneal Motor (Ext Dig Brev)  35.8C  Ankle    3.9 <6.0 1.3 >2.5 B Fib Ankle 8.9 40.0 45 >40  B Fib    12.8  1.2  Poplt B Fib 2.2 9.0 41 >40  Poplt    15.0  1.1         Left Peroneal TA Motor (Tib Ant)  35.8C  Fib Head    3.3 <4.5 3.4 >3 Poplit Fib Head 1.7 9.0 53 >40  Poplit    5.0  3.0         Right Peroneal TA Motor (Tib Ant)  35.8C  Fib Head    4.3 <4.5 3.5 >3 Poplit Fib Head 1.3 9.0 69 >40  Poplit    5.6  3.5         Left Tibial Motor (Abd Hall Brev)  35.8C  Ankle    5.0 <6.0 5.9 >4 Knee Ankle 10.9 43.0 39 >40  Knee    15.9  4.0         Right Tibial Motor (Abd Hall Brev)  35.8C  Ankle    4.3 <6.0 4.7 >4 Knee Ankle 11.5 44.0 38 >40  Knee    15.8  2.4          H Reflex Studies   NR H-Lat (ms) Lat Norm (ms) L-R H-Lat (ms)  Left Tibial (Gastroc)  35.8C  NR  <35   Right Tibial (Gastroc)  35.8C  NR  <35    EMG   Side Muscle Ins Act Fibs Psw Fasc Number Recrt Dur Dur. Amp Amp. Poly Poly. Comment  Left AntTibialis Nml Nml Nml Nml 1- Rapid Some 1+ Some 1+ Some 1+ N/A  Left Gastroc Nml Nml Nml Nml 1- Mod-R Some 1+ Few 1+ Nml Nml N/A  Left Flex Dig Long Nml Nml Nml Nml 2- Rapid Some 1+ Some 1+ Some 1+ N/A  Left RectFemoris Nml Nml Nml Nml Nml Nml Nml Nml Nml Nml Nml Nml N/A  Left BicepsFemS Nml Nml Nml Nml Nml Nml Nml Nml Nml Nml Nml Nml N/A  Right BicepsFemS Nml Nml Nml Nml Nml Nml Nml Nml Nml Nml Nml Nml N/A  Left GluteusMed Nml Nml Nml Nml Nml Nml Nml Nml Nml Nml Nml Nml N/A  Right AntTibialis Nml Nml Nml Nml 1- Rapid Some 1+ Some 1+ Nml Nml N/A  Right Gastroc Nml 1+ Nml Nml 2- Rapid Some 1+ Some 1+ Nml Nml N/A  Right Flex Dig Long Nml Nml Nml Nml 2- Rapid Some 1+ Some 1+ Nml Nml N/A  Right RectFemoris Nml Nml Nml Nml 1- Rapid Some 1+ Some 1+ Nml Nml N/A    Right GluteusMed Nml Nml Nml Nml Nml Nml Nml Nml Nml Nml Nml Nml N/A      Waveforms:

## 2017-10-08 ENCOUNTER — Encounter: Payer: Self-pay | Admitting: Cardiology

## 2017-10-08 ENCOUNTER — Telehealth: Payer: Self-pay | Admitting: *Deleted

## 2017-10-08 ENCOUNTER — Encounter: Payer: Self-pay | Admitting: Neurology

## 2017-10-08 MED ORDER — GABAPENTIN 100 MG PO CAPS
100.0000 mg | ORAL_CAPSULE | Freq: Three times a day (TID) | ORAL | 5 refills | Status: DC
Start: 2017-10-08 — End: 2018-08-11

## 2017-10-08 NOTE — Telephone Encounter (Signed)
-----   Message from Alda Berthold, DO sent at 10/08/2017  8:22 AM EDT ----- Please inform patient that his nerve testing showed neuropathy as well as possibly nerve impingement in the back.  Recommend MRI lumbar spine wo contrast as the next step. Thanks.

## 2017-10-08 NOTE — Telephone Encounter (Signed)
Dr. Posey Pronto has spoken with patient.

## 2017-10-08 NOTE — Telephone Encounter (Signed)
Returned call to patient and discussed results of EMG which showed sensorimotor polyneuropathy and R L4 radiculopathy.  For pain, will offer gabapentin 100mg  to combine with 300mg  tablets, due to cognitive side effects with gabapentin 600mg . If leg symptoms get worse, we can consider MRI lumbar spine going forward. He continues to be evaluated for amyloidosis and is discussing potential biopsy of the heart or kidney to further evaluate.  Nerve biopsy can be considered, if other organ systems cannot be biopsied, but would not favor this is as the first option.  Panayiota Larkin K. Posey Pronto, DO

## 2017-10-09 DIAGNOSIS — E854 Organ-limited amyloidosis: Secondary | ICD-10-CM | POA: Diagnosis not present

## 2017-10-09 DIAGNOSIS — I43 Cardiomyopathy in diseases classified elsewhere: Secondary | ICD-10-CM | POA: Diagnosis not present

## 2017-10-16 ENCOUNTER — Encounter: Payer: Self-pay | Admitting: Neurology

## 2017-10-21 DIAGNOSIS — I429 Cardiomyopathy, unspecified: Secondary | ICD-10-CM | POA: Diagnosis not present

## 2017-10-21 DIAGNOSIS — I440 Atrioventricular block, first degree: Secondary | ICD-10-CM | POA: Diagnosis not present

## 2017-10-21 DIAGNOSIS — I43 Cardiomyopathy in diseases classified elsewhere: Secondary | ICD-10-CM | POA: Diagnosis not present

## 2017-10-21 DIAGNOSIS — I4581 Long QT syndrome: Secondary | ICD-10-CM | POA: Diagnosis not present

## 2017-10-21 DIAGNOSIS — E854 Organ-limited amyloidosis: Secondary | ICD-10-CM | POA: Diagnosis not present

## 2017-10-21 DIAGNOSIS — Z01810 Encounter for preprocedural cardiovascular examination: Secondary | ICD-10-CM | POA: Diagnosis not present

## 2017-10-23 ENCOUNTER — Ambulatory Visit: Payer: Medicare Other | Admitting: Cardiology

## 2017-10-26 ENCOUNTER — Encounter: Payer: Self-pay | Admitting: Neurology

## 2017-10-28 ENCOUNTER — Ambulatory Visit: Payer: Self-pay | Admitting: Neurology

## 2017-11-04 ENCOUNTER — Other Ambulatory Visit: Payer: Self-pay | Admitting: *Deleted

## 2017-11-04 ENCOUNTER — Encounter: Payer: Self-pay | Admitting: Neurology

## 2017-11-04 MED ORDER — GABAPENTIN 300 MG PO CAPS: 300.0000 mg | ORAL_CAPSULE | Freq: Every day | ORAL | 5 refills | Status: AC

## 2017-11-11 ENCOUNTER — Encounter: Payer: Self-pay | Admitting: Family Medicine

## 2017-11-11 ENCOUNTER — Ambulatory Visit: Payer: Self-pay | Admitting: *Deleted

## 2017-11-11 ENCOUNTER — Encounter: Payer: Self-pay | Admitting: Neurology

## 2017-11-11 DIAGNOSIS — H538 Other visual disturbances: Secondary | ICD-10-CM | POA: Diagnosis not present

## 2017-11-11 NOTE — Telephone Encounter (Signed)
Spoke with patient and explained notes from Dr. Birdie Riddle. Attempted to schedule appointment with Elyn Aquas, PA - patient stated that he would have to call back in a few minutes to schedule.

## 2017-11-11 NOTE — Telephone Encounter (Signed)
My schedule is full today but I would encourage him to schedule with Einar Pheasant today for an evaluation- particularly w/ feeling faint and visual changes.  Please let him know this is my recommendation

## 2017-11-11 NOTE — Telephone Encounter (Signed)
Patient apparently would not schedule appointment because he wanted to only be seen by [facility].   Routed to PCP

## 2017-11-11 NOTE — Telephone Encounter (Signed)
Pt states that this happened last night at dinner where had double vision. He could cover one eye and see fine and cover the other eye and see fine. And he said that he never lost his eyesight completely. He states that he is still feeling "slight pressure around the eyes".  He also felt faint once yesterday that lasted about 2 mins.  He wants to speak with Dr. Birdie Riddle. Did not want an appoint with another provider.   Reason for Disposition . [1] Brief (now gone) blurred vision AND [2] unexplained  Answer Assessment - Initial Assessment Questions 1. DESCRIPTION: "What is the vision loss like? Describe it for me." (e.g., complete vision loss, blurred vision, double vision, floaters, etc.)     Double vision last night. Has improved over the course of the morning. Feeling of slight pressure around the eyes. 2. LOCATION: "One or both eyes?" If one, ask: "Which eye?"     Both eye, can cover one eye and see perfectly and the same for the other eye 3. SEVERITY: "Can you see anything?" If so, ask: "What can you see?" (e.g., fine print)     yes 4. ONSET: "When did this begin?" "Did it start suddenly or has this been gradual?"     Last night 5. PATTERN: "Does this come and go, or has it been constant since it started?"     no 6. PAIN: "Is there any pain in your eye(s)?"  (Scale 1-10; or mild, moderate, severe)     no 7. CONTACTS-GLASSES: "Do you wear contacts or glasses?"     no 8. CAUSE: "What do you think is causing this visual problem?"     Not sure 9. OTHER SYMPTOMS: "Do you have any other symptoms?" (e.g., confusion, headache, arm or leg weakness, speech problems)     no 10. PREGNANCY: "Is there any chance you are pregnant?" "When was your last menstrual period?"       no  Protocols used: [place]

## 2017-11-13 DIAGNOSIS — E854 Organ-limited amyloidosis: Secondary | ICD-10-CM | POA: Diagnosis not present

## 2017-11-13 DIAGNOSIS — I43 Cardiomyopathy in diseases classified elsewhere: Secondary | ICD-10-CM | POA: Diagnosis not present

## 2017-11-13 DIAGNOSIS — E8581 Light chain (AL) amyloidosis: Secondary | ICD-10-CM | POA: Diagnosis not present

## 2017-11-20 DIAGNOSIS — E8581 Light chain (AL) amyloidosis: Secondary | ICD-10-CM | POA: Diagnosis not present

## 2017-11-27 DIAGNOSIS — G629 Polyneuropathy, unspecified: Secondary | ICD-10-CM | POA: Diagnosis not present

## 2017-11-27 DIAGNOSIS — I43 Cardiomyopathy in diseases classified elsewhere: Secondary | ICD-10-CM | POA: Diagnosis not present

## 2017-11-27 DIAGNOSIS — E8581 Light chain (AL) amyloidosis: Secondary | ICD-10-CM | POA: Diagnosis not present

## 2017-11-27 DIAGNOSIS — E854 Organ-limited amyloidosis: Secondary | ICD-10-CM | POA: Diagnosis not present

## 2017-12-11 DIAGNOSIS — R600 Localized edema: Secondary | ICD-10-CM | POA: Diagnosis not present

## 2017-12-11 DIAGNOSIS — E8581 Light chain (AL) amyloidosis: Secondary | ICD-10-CM | POA: Diagnosis not present

## 2017-12-11 DIAGNOSIS — E851 Neuropathic heredofamilial amyloidosis: Secondary | ICD-10-CM | POA: Diagnosis not present

## 2017-12-11 DIAGNOSIS — E854 Organ-limited amyloidosis: Secondary | ICD-10-CM | POA: Diagnosis not present

## 2017-12-14 ENCOUNTER — Inpatient Hospital Stay (HOSPITAL_BASED_OUTPATIENT_CLINIC_OR_DEPARTMENT_OTHER)
Admission: EM | Admit: 2017-12-14 | Discharge: 2017-12-17 | DRG: 389 | Disposition: A | Discharge status: Home / Self Care | Payer: Medicare Other | Attending: Internal Medicine | Admitting: Internal Medicine

## 2017-12-14 ENCOUNTER — Encounter (HOSPITAL_BASED_OUTPATIENT_CLINIC_OR_DEPARTMENT_OTHER): Payer: Self-pay | Admitting: Adult Health

## 2017-12-14 ENCOUNTER — Other Ambulatory Visit: Payer: Self-pay

## 2017-12-14 DIAGNOSIS — K56609 Unspecified intestinal obstruction, unspecified as to partial versus complete obstruction: Secondary | ICD-10-CM | POA: Diagnosis not present

## 2017-12-14 DIAGNOSIS — N400 Enlarged prostate without lower urinary tract symptoms: Secondary | ICD-10-CM | POA: Diagnosis present

## 2017-12-14 DIAGNOSIS — Z79899 Other long term (current) drug therapy: Secondary | ICD-10-CM

## 2017-12-14 DIAGNOSIS — Z96641 Presence of right artificial hip joint: Secondary | ICD-10-CM | POA: Diagnosis present

## 2017-12-14 DIAGNOSIS — K219 Gastro-esophageal reflux disease without esophagitis: Secondary | ICD-10-CM | POA: Diagnosis present

## 2017-12-14 DIAGNOSIS — E859 Amyloidosis, unspecified: Secondary | ICD-10-CM | POA: Diagnosis present

## 2017-12-14 DIAGNOSIS — K56699 Other intestinal obstruction unspecified as to partial versus complete obstruction: Secondary | ICD-10-CM | POA: Diagnosis not present

## 2017-12-14 DIAGNOSIS — K5900 Constipation, unspecified: Secondary | ICD-10-CM | POA: Diagnosis not present

## 2017-12-14 DIAGNOSIS — K56600 Partial intestinal obstruction, unspecified as to cause: Secondary | ICD-10-CM | POA: Diagnosis not present

## 2017-12-14 DIAGNOSIS — I5032 Chronic diastolic (congestive) heart failure: Secondary | ICD-10-CM | POA: Diagnosis present

## 2017-12-14 DIAGNOSIS — G629 Polyneuropathy, unspecified: Secondary | ICD-10-CM

## 2017-12-14 DIAGNOSIS — E8581 Light chain (AL) amyloidosis: Secondary | ICD-10-CM | POA: Diagnosis present

## 2017-12-14 DIAGNOSIS — R0602 Shortness of breath: Secondary | ICD-10-CM | POA: Diagnosis not present

## 2017-12-14 DIAGNOSIS — R109 Unspecified abdominal pain: Secondary | ICD-10-CM | POA: Diagnosis not present

## 2017-12-14 HISTORY — DX: Heart failure, unspecified: I50.9

## 2017-12-14 HISTORY — DX: Amyloidosis, unspecified: E85.9

## 2017-12-14 NOTE — ED Triage Notes (Signed)
Presents with abdominal distention, bloating and nausea that began today. Last BM Saturday. Pt states, "I think food id not getting from my stomach to my small intestine"  Abdomen is distended.

## 2017-12-15 ENCOUNTER — Emergency Department (HOSPITAL_BASED_OUTPATIENT_CLINIC_OR_DEPARTMENT_OTHER): Payer: Medicare Other

## 2017-12-15 ENCOUNTER — Encounter (HOSPITAL_COMMUNITY): Payer: Self-pay | Admitting: Internal Medicine

## 2017-12-15 ENCOUNTER — Other Ambulatory Visit: Payer: Self-pay

## 2017-12-15 DIAGNOSIS — E859 Amyloidosis, unspecified: Secondary | ICD-10-CM | POA: Diagnosis present

## 2017-12-15 DIAGNOSIS — E8581 Light chain (AL) amyloidosis: Secondary | ICD-10-CM | POA: Diagnosis present

## 2017-12-15 DIAGNOSIS — G629 Polyneuropathy, unspecified: Secondary | ICD-10-CM | POA: Diagnosis not present

## 2017-12-15 DIAGNOSIS — K56609 Unspecified intestinal obstruction, unspecified as to partial versus complete obstruction: Secondary | ICD-10-CM | POA: Diagnosis not present

## 2017-12-15 DIAGNOSIS — Z96641 Presence of right artificial hip joint: Secondary | ICD-10-CM | POA: Diagnosis present

## 2017-12-15 DIAGNOSIS — Z79899 Other long term (current) drug therapy: Secondary | ICD-10-CM | POA: Diagnosis not present

## 2017-12-15 DIAGNOSIS — K219 Gastro-esophageal reflux disease without esophagitis: Secondary | ICD-10-CM | POA: Diagnosis present

## 2017-12-15 DIAGNOSIS — K56600 Partial intestinal obstruction, unspecified as to cause: Secondary | ICD-10-CM | POA: Diagnosis not present

## 2017-12-15 DIAGNOSIS — I5032 Chronic diastolic (congestive) heart failure: Secondary | ICD-10-CM | POA: Diagnosis not present

## 2017-12-15 DIAGNOSIS — N400 Enlarged prostate without lower urinary tract symptoms: Secondary | ICD-10-CM | POA: Diagnosis present

## 2017-12-15 LAB — URINALYSIS, ROUTINE W REFLEX MICROSCOPIC
Bilirubin Urine: NEGATIVE
Glucose, UA: NEGATIVE mg/dL
Hgb urine dipstick: NEGATIVE
Ketones, ur: NEGATIVE mg/dL
Leukocytes, UA: NEGATIVE
NITRITE: NEGATIVE
PROTEIN: 100 mg/dL — AB
pH: 5.5 (ref 5.0–8.0)

## 2017-12-15 LAB — CBC WITH DIFFERENTIAL/PLATELET
BASOS ABS: 0 10*3/uL (ref 0.0–0.1)
BASOS PCT: 0 %
Eosinophils Absolute: 0 10*3/uL (ref 0.0–0.7)
Eosinophils Relative: 0 %
HCT: 41 % (ref 39.0–52.0)
HEMOGLOBIN: 14 g/dL (ref 13.0–17.0)
Lymphocytes Relative: 18 %
Lymphs Abs: 0.8 10*3/uL (ref 0.7–4.0)
MCH: 31.4 pg (ref 26.0–34.0)
MCHC: 34.1 g/dL (ref 30.0–36.0)
MCV: 91.9 fL (ref 78.0–100.0)
Monocytes Absolute: 0.5 10*3/uL (ref 0.1–1.0)
Monocytes Relative: 11 %
NEUTROS ABS: 3.3 10*3/uL (ref 1.7–7.7)
NEUTROS PCT: 71 %
Platelets: 134 10*3/uL — ABNORMAL LOW (ref 150–400)
RBC: 4.46 MIL/uL (ref 4.22–5.81)
RDW: 14.4 % (ref 11.5–15.5)
WBC: 4.6 10*3/uL (ref 4.0–10.5)

## 2017-12-15 LAB — LIPASE, BLOOD: Lipase: 38 U/L (ref 11–51)

## 2017-12-15 LAB — BASIC METABOLIC PANEL
ANION GAP: 9 (ref 5–15)
BUN: 20 mg/dL (ref 6–20)
CHLORIDE: 103 mmol/L (ref 101–111)
CO2: 25 mmol/L (ref 22–32)
Calcium: 8.9 mg/dL (ref 8.9–10.3)
Creatinine, Ser: 1.28 mg/dL — ABNORMAL HIGH (ref 0.61–1.24)
GFR calc non Af Amer: 55 mL/min — ABNORMAL LOW (ref >60)
Glucose, Bld: 101 mg/dL — ABNORMAL HIGH (ref 65–99)
POTASSIUM: 4.1 mmol/L (ref 3.5–5.1)
Sodium: 137 mmol/L (ref 135–145)

## 2017-12-15 LAB — URINALYSIS, MICROSCOPIC (REFLEX)
RBC / HPF: NONE SEEN RBC/hpf (ref 0–5)
WBC UA: NONE SEEN WBC/hpf (ref 0–5)

## 2017-12-15 MED ORDER — MORPHINE SULFATE (PF) 4 MG/ML IV SOLN
4.0000 mg | Freq: Once | INTRAVENOUS | Status: AC
  Administered 2017-12-15: 4 mg via INTRAVENOUS
  Filled 2017-12-15: qty 1

## 2017-12-15 MED ORDER — PANTOPRAZOLE SODIUM 40 MG PO TBEC
40.0000 mg | DELAYED_RELEASE_TABLET | Freq: Every day | ORAL | Status: DC
  Administered 2017-12-15 – 2017-12-17 (×3): 40 mg via ORAL
  Filled 2017-12-15 (×3): qty 1

## 2017-12-15 MED ORDER — ACETAMINOPHEN 325 MG PO TABS: 650.0000 mg | ORAL_TABLET | Freq: Four times a day (QID) | ORAL | Status: DC | PRN

## 2017-12-15 MED ORDER — OXYMETAZOLINE HCL 0.05 % NA SOLN
1.0000 | Freq: Once | NASAL | Status: AC
  Administered 2017-12-15: 1 via NASAL

## 2017-12-15 MED ORDER — GABAPENTIN 300 MG PO CAPS
300.0000 mg | ORAL_CAPSULE | Freq: Three times a day (TID) | ORAL | Status: DC
  Filled 2017-12-15 (×2): qty 1

## 2017-12-15 MED ORDER — ONDANSETRON HCL 4 MG/2ML IJ SOLN
4.0000 mg | Freq: Once | INTRAMUSCULAR | Status: AC
  Administered 2017-12-15: 4 mg via INTRAVENOUS
  Filled 2017-12-15: qty 2

## 2017-12-15 MED ORDER — GABAPENTIN 100 MG PO CAPS: 100.0000 mg | ORAL_CAPSULE | Freq: Three times a day (TID) | ORAL | Status: DC

## 2017-12-15 MED ORDER — GABAPENTIN 400 MG PO CAPS
400.0000 mg | ORAL_CAPSULE | Freq: Once | ORAL | Status: AC
  Administered 2017-12-15: 400 mg via ORAL
  Filled 2017-12-15: qty 1

## 2017-12-15 MED ORDER — SODIUM CHLORIDE 0.9 % IV SOLN
Freq: Once | INTRAVENOUS | Status: AC
  Administered 2017-12-15: 04:00:00 via INTRAVENOUS

## 2017-12-15 MED ORDER — ONDANSETRON HCL 4 MG/2ML IJ SOLN
4.0000 mg | Freq: Four times a day (QID) | INTRAMUSCULAR | Status: DC | PRN
  Administered 2017-12-16: 4 mg via INTRAVENOUS
  Filled 2017-12-15: qty 2

## 2017-12-15 MED ORDER — MORPHINE SULFATE (PF) 2 MG/ML IV SOLN: 2.0000 mg | INTRAVENOUS | Status: DC | PRN

## 2017-12-15 MED ORDER — OXYMETAZOLINE HCL 0.05 % NA SOLN
NASAL | Status: AC
  Administered 2017-12-15: 1 via NASAL
  Filled 2017-12-15: qty 15

## 2017-12-15 MED ORDER — ACETAMINOPHEN 650 MG RE SUPP: 650.0000 mg | Freq: Four times a day (QID) | RECTAL | Status: DC | PRN

## 2017-12-15 MED ORDER — DOXYCYCLINE HYCLATE 100 MG IV SOLR
100.0000 mg | Freq: Two times a day (BID) | INTRAVENOUS | Status: DC
  Administered 2017-12-15 – 2017-12-16 (×3): 100 mg via INTRAVENOUS
  Filled 2017-12-15 (×3): qty 100

## 2017-12-15 MED ORDER — B COMPLEX-C PO TABS
1.0000 | ORAL_TABLET | Freq: Every day | ORAL | Status: DC
  Administered 2017-12-15 – 2017-12-17 (×3): 1 via ORAL
  Filled 2017-12-15 (×4): qty 1

## 2017-12-15 MED ORDER — ACYCLOVIR 200 MG PO CAPS
400.0000 mg | ORAL_CAPSULE | Freq: Two times a day (BID) | ORAL | Status: DC
  Administered 2017-12-15 – 2017-12-16 (×2): 400 mg via ORAL
  Administered 2017-12-16: 200 mg via ORAL
  Administered 2017-12-17: 400 mg via ORAL
  Filled 2017-12-15 (×5): qty 2

## 2017-12-15 MED ORDER — BENZOCAINE 20 % MT AERO
INHALATION_SPRAY | OROMUCOSAL | Status: AC
  Administered 2017-12-15: 1 via OROMUCOSAL
  Filled 2017-12-15: qty 57

## 2017-12-15 MED ORDER — LIDOCAINE HCL 2 % EX GEL
CUTANEOUS | Status: AC
  Administered 2017-12-15: 20
  Filled 2017-12-15: qty 20

## 2017-12-15 MED ORDER — IOPAMIDOL (ISOVUE-300) INJECTION 61%
100.0000 mL | Freq: Once | INTRAVENOUS | Status: AC | PRN
  Administered 2017-12-15: 100 mL via INTRAVENOUS

## 2017-12-15 MED ORDER — LIDOCAINE HCL 2 % EX GEL
1.0000 "application " | Freq: Once | CUTANEOUS | Status: AC
  Administered 2017-12-15: 20

## 2017-12-15 MED ORDER — BENZOCAINE 20 % MT AERO
INHALATION_SPRAY | Freq: Once | OROMUCOSAL | Status: AC
  Administered 2017-12-15: 1 via OROMUCOSAL

## 2017-12-15 MED ORDER — DOXYCYCLINE HYCLATE 100 MG IV SOLR
INTRAVENOUS | Status: AC
  Filled 2017-12-15: qty 100

## 2017-12-15 MED ORDER — ENOXAPARIN SODIUM 40 MG/0.4ML ~~LOC~~ SOLN
40.0000 mg | SUBCUTANEOUS | Status: DC
  Administered 2017-12-15: 40 mg via SUBCUTANEOUS
  Filled 2017-12-15 (×2): qty 0.4

## 2017-12-15 MED ORDER — SODIUM CHLORIDE 0.9 % IV SOLN
INTRAVENOUS | Status: AC
  Administered 2017-12-15: 22:00:00 via INTRAVENOUS

## 2017-12-15 NOTE — ED Notes (Signed)
Alert, NAD, calm, interactive, resps e/u, speaking in clear complete sentences, no dyspnea noted, VSS. Family at [facility]. Pt to xray via stretcher.

## 2017-12-15 NOTE — ED Notes (Signed)
Alert, NAD, calm, interactive, resps e/u, speaking in clear complete sentences, no dyspnea noted, skin W&D, c/o abd pain, bloating, and nausea, "maybe constipation", last BM Thursday, sx onset Tuesday, (denies: sob, VD, fever, bleeding, dizziness or visual changes). Family at [facility].

## 2017-12-15 NOTE — ED Provider Notes (Signed)
3:58 PM While patient has been waiting for a bed for inpatient medical management of a small bowel obstruction, patient requested consideration of transfer to [facility] where he currently gets his weekly treatments for his amyloidosis.  Patient was reevaluated by me.  Patient reports that his pain has improved in his abdomen as well as distention of the NG tube has been placed and significant amounts of fluid have been removed.  Patient will need admission for bowel rest and slow reintroduction of food and fluids.  As patient's care is primarily at [facility], it was felt a reasonable request to call [facility].  Next  Dr. Burnett Corrente at [facility] agreed with medical admission for small bowel obstruction at their facility.  Arrangements will be made including creation of imaging desk to accompany patient.  Patient will be transferred and admitted to the service of Dr. Burnett Corrente.    5:25 PM While awaiting assignment of a bed at [facility], patient received a bed at this facility.  Patient was asked which facility they would rather go to at this time given the new circumstances and they would rather get admitted at [facility] which would be quicker as they have been in the emergency department for over 18 hours already.   [facility] will be called to cancel the bed and patient will be admitted for further management of his small bowel obstruction at [facility].     Clinical Impression: 1. SBO (small bowel obstruction) ([place])     Disposition: Admit  This note was prepared with assistance of Dragon voice recognition software. Occasional wrong-word or sound-a-like substitutions may have occurred due to the inherent limitations of voice recognition software.       Tegeler, Gwenyth Allegra, MD 12/15/17 [phone]

## 2017-12-15 NOTE — ED Provider Notes (Signed)
[place] EMERGENCY DEPARTMENT Provider Note   CSN: [id number] Arrival date & time: 12/14/17  2314     History   Chief Complaint Chief Complaint  Patient presents with  . Abdominal Pain    HPI Bobby Harper is a 72 y.o. male.  HPI  This is a 72 year old male with a history of amyloidosis, heart failure who presents with abdominal pain and nausea.  Patient reports onset of symptoms for the last 2-3 days.  Last bowel movement was on Thursday.  He states initially it started as a bloated feeling.  He has felt nausea without vomiting.  He states "I think my bowels are backed up to my stomach."  No history of abdominal surgeries.  He does receive chemotherapy at [facility] for his amyloidosis.  Denies any fevers or infectious symptoms.  Denies urinary symptoms or dysuria.  Patient reports worsening shortness of breath and some increased lower extremity edema.  He takes daily Lasix.  Denies chest pain.  Past Medical History:  Diagnosis Date  . Amyloidosis ([place])   . Arthritis 06-23-13   osteoarthritis right hip/some left shoulder pain  . BPH (benign prostatic hyperplasia)   . Congestive heart failure (CHF) ([place])   . Right hip pain    FOLLOWS DR. FOR ARTHRITIS    Patient Active Problem List   Diagnosis Date Noted  . SBO (small bowel obstruction) ([place]) 12/15/2017  . LVH (left ventricular hypertrophy) 08/07/2017  . CHF (congestive heart failure) ([place]) 08/07/2017  . Dyspnea on exertion 08/07/2017  . Recurrent cold sores 09/13/2015  . Elevated BP 05/26/2014  . Non-healing skin lesion 09/03/2013  . Expected blood loss anemia 06/30/2013  . Overweight (BMI 25.0-29.9) 06/30/2013  . S/P right THA, AA 06/29/2013  . Lumbar back pain 08/13/2012  . General medical examination 09/22/2011  . BPH (benign prostatic hyperplasia) 04/07/2009  . URI 01/28/2008    Past Surgical History:  Procedure Laterality Date  . REFRACTIVE SURGERY    . TOTAL HIP ARTHROPLASTY Right  06/29/2013   Procedure: RIGHT TOTAL HIP ARTHROPLASTY ANTERIOR APPROACH;  Surgeon: Mauri Pole, MD;  Location: [facility];  Service: Orthopedics;  Laterality: Right;  Marland Kitchen VASECTOMY         Home Medications    Prior to Admission medications   Medication Sig Start Date End Date Taking? Authorizing Provider  acyclovir (ZOVIRAX) 200 MG capsule Take 200 mg by mouth 5 (five) times daily.   Yes [provider]  aspirin EC 81 MG tablet Take 81 mg by mouth daily.   Yes [provider]  bortezomib IV (VELCADE) 3.5 MG injection Inject 1.3 mg/m2 into the vein once.   Yes [provider]  doxycycline (VIBRA-TABS) 100 MG tablet Take 100 mg by mouth 2 (two) times daily.   Yes [provider]  furosemide (LASIX) 20 MG tablet Take 1 tablet (20 mg total) by mouth daily. Patient taking differently: Take 20 mg by mouth daily as needed for edema.  08/08/17 12/14/17 Yes Park Liter, MD  gabapentin (NEURONTIN) 100 MG capsule Take 1 capsule (100 mg total) by mouth 3 (three) times daily. 10/08/17  Yes Patel, Donika K, DO  gabapentin (NEURONTIN) 300 MG capsule Take 1 capsule (300 mg total) by mouth at bedtime. 11/04/17  Yes Patel, Donika K, DO  omeprazole (PRILOSEC) 20 MG capsule Take 20 mg by mouth daily.   Yes [provider]  potassium chloride (K-DUR) 10 MEQ tablet Take 1 tablet (10 mEq total) by mouth  daily. Patient taking differently: Take 10 mEq by mouth daily as needed (Takes with Lasix).  08/08/17 12/14/17 Yes Park Liter, MD  b complex vitamins tablet Take 1 tablet by mouth daily.    [provider]  cholecalciferol (VITAMIN D) 1000 units tablet Take 1,000 Units by mouth daily.    [provider]  Omega-3 Fatty Acids (FISH OIL) 1000 MG CAPS Take 1 capsule by mouth daily.    [provider]    Family History Family History  Problem Relation Age of Onset  . Coronary artery disease Mother   . Hypertension Mother   . Kidney  failure Mother   . Heart attack Father   . Heart disease Father 18       MI    Social History Social History   Tobacco Use  . Smoking status: Never Smoker  . Smokeless tobacco: Never Used  Substance Use Topics  . Alcohol use: Yes    Comment: weekends, socially  . Drug use: No     Allergies   Sulfonamide derivatives   Review of Systems Review of Systems  Constitutional: Negative for fever.  Respiratory: Positive for shortness of breath. Negative for cough.   Cardiovascular: Positive for leg swelling. Negative for chest pain.  Gastrointestinal: Positive for abdominal pain, constipation and nausea. Negative for diarrhea and vomiting.  Genitourinary: Negative for dysuria.  All other systems reviewed and are negative.    Physical Exam Updated Vital Signs BP 129/72   Pulse 78   Temp 98.8 F (37.1 C) (Oral)   Resp 13   Wt 99.8 kg (220 lb)   SpO2 93%   BMI 29.03 kg/m   Physical Exam  Constitutional: He is oriented to person, place, and time.  Ill-appearing but nontoxic, no acute distress  HENT:  Head: Normocephalic and atraumatic.  Neck: Neck supple.  Cardiovascular: Normal rate, regular rhythm and normal heart sounds.  No murmur heard. Pulmonary/Chest: Effort normal. No respiratory distress. He has no wheezes. He has rales.  Diminished right lower lobe  Abdominal: Soft. He exhibits distension. Bowel sounds are decreased. There is generalized tenderness. There is no rebound and no guarding.  Musculoskeletal: He exhibits edema.  Lymphadenopathy:    He has no cervical adenopathy.  Neurological: He is alert and oriented to person, place, and time.  Skin: Skin is warm and dry.  Psychiatric: He has a normal mood and affect.  Nursing note and vitals reviewed.    ED Treatments / Results  Labs (all labs ordered are listed, but only abnormal results are displayed) Labs Reviewed  CBC WITH DIFFERENTIAL/PLATELET - Abnormal; Notable for the following components:       Result Value   Platelets 134 (*)    All other components within normal limits  BASIC METABOLIC PANEL - Abnormal; Notable for the following components:   Glucose, Bld 101 (*)    Creatinine, Ser 1.28 (*)    GFR calc non Af Amer 55 (*)    All other components within normal limits  LIPASE, BLOOD  URINALYSIS, ROUTINE W REFLEX MICROSCOPIC    EKG  EKG Interpretation  Date/Time:  Monday December 15 2017 00:28:14 EST Ventricular Rate:  80 PR Interval:    QRS Duration: 83 QT Interval:  427 QTC Calculation: 493 R Axis:   -31 Text Interpretation:  Sinus rhythm Prolonged PR interval Inferior infarct, old Anterior infarct, old No prior for comparison Confirmed by Thayer Jew [phone]) on 12/15/2017 3:19:12 AM  Radiology Ct Abdomen Pelvis W Contrast  Result Date: 12/15/2017 CLINICAL DATA:  72 y/o  M; small-bowel obstruction. EXAM: CT ABDOMEN AND PELVIS WITH CONTRAST TECHNIQUE: Multidetector CT imaging of the abdomen and pelvis was performed using the standard protocol following bolus administration of intravenous contrast. CONTRAST:  187mL ISOVUE-300 IOPAMIDOL (ISOVUE-300) INJECTION 61% COMPARISON:  None. FINDINGS: Lower chest: 4 mm right middle lobe nodule (series 2, image 4). Small right pleural effusion. Hepatobiliary: No focal liver abnormality is seen. No gallstones, gallbladder wall thickening, or biliary dilatation. Pancreas: Unremarkable. No pancreatic ductal dilatation or surrounding inflammatory changes. Spleen: Normal in size without focal abnormality. Adrenals/Urinary Tract: Normal adrenal glands. Left kidney interpolar 22 mm cyst. No hydronephrosis. Mild bladder distention. Stomach/Bowel: Diffuse dilated small bowel to the level of distal ileum. Long segment of small bowel in the mid lower abdomen with wall thickening. Normal appearance of the colon. No evidence for perforation or abscess at this time. Vascular/Lymphatic: Aortic atherosclerosis. No enlarged abdominal or pelvic lymph  nodes. Reproductive: Prostate is unremarkable. Other: Small volume of peritoneal ascites. Musculoskeletal: Right total hip prosthesis is partially visualized. No acute osseous abnormality identified. IMPRESSION: 1. Diffuse dilatation of small bowel to the level of distal ileum compatible with small bowel obstruction. No perforation at this time. 2. Long segment of small bowel in the lower mid abdomen with wall thickening may represent infectious or inflammatory enteritis. Small bowel is distended both upstream and downstream. 3. Small right pleural effusion. 4. Small volume of ascites. 5. Aortic atherosclerosis. 6. 4 mm nodule in right middle lobe. No follow-up needed if patient is low-risk. Non-contrast chest CT can be considered in 12 months if patient is high-risk. This recommendation follows the consensus statement: Guidelines for Management of Incidental Pulmonary Nodules Detected on CT Images: From the Fleischner Society 2017; Radiology 2017; 284:228-243. Electronically Signed   By: Kristine Garbe M.D.   On: 12/15/2017 03:29   Dg Abdomen Acute W/chest  Result Date: 12/15/2017 CLINICAL DATA:  72 y/o M; abdominal pain, nausea, vomiting, constipation for 2 days. EXAM: DG ABDOMEN ACUTE W/ 1V CHEST COMPARISON:  None. FINDINGS: Right small pleural effusion. Right basilar opacity. Otherwise clear lungs. Mildly enlarged cardiac silhouette. Small bowel obstruction.  Mild degenerative changes of lumbar spine. IMPRESSION: 1. Small bowel obstruction. 2. Small right pleural effusion and basilar opacity probably representing associated atelectasis. 3. Mild cardiomegaly. Electronically Signed   By: Kristine Garbe M.D.   On: 12/15/2017 01:30    Procedures Procedures (including critical care time)  Medications Ordered in ED Medications  morphine 2 MG/ML injection 2-4 mg (not administered)  ondansetron (ZOFRAN) injection 4 mg (not administered)  morphine 4 MG/ML injection 4 mg (4 mg Intravenous  Given 12/15/17 0100)  ondansetron (ZOFRAN) injection 4 mg (4 mg Intravenous Given 12/15/17 0100)  oxymetazoline (AFRIN) 0.05 % nasal spray 1 spray (1 spray Each Nare Given 12/15/17 0214)  lidocaine (XYLOCAINE) 2 % jelly 1 application (20 application Other Given 12/15/17 0215)  iopamidol (ISOVUE-300) 61 % injection 100 mL (100 mLs Intravenous Contrast Given 12/15/17 0247)  0.9 %  sodium chloride infusion ( Intravenous New Bag/Given 12/15/17 0345)  Benzocaine (HURRCAINE) 20 % mouth spray (1 application Mouth/Throat Given 12/15/17 0417)     Initial Impression / Assessment and Plan / ED Course  I have reviewed the triage vital signs and the nursing notes.  Pertinent labs & imaging results that were available during my care of the patient were reviewed by me and considered in my medical decision making (see  chart for details).    Patient presents with abdominal pain, nausea, and no bowel movement since Thursday.  He is mildly distended.  No signs of peritonitis but tender on exam.  He is otherwise nontoxic appearing.  Vital signs notable for oxygen saturation of 93.  Workup initiated.  Acute abdominal series concerning for obstruction.  Patient also has a pleural effusion.  Will obtain CT scan.  NG tube was placed with approximately 500 cc of nonbloody return.  CT scan confirms obstruction.  Appears inflammatory with thickened bowel wall.  No mass or transition point.  Discussed with the hospitalist.  Patient feels much better with NG tube placement.  He is clinically stable.  Will admit to the hospitalist.  Surgery to be consulted upon arrival to [facility].  Final Clinical Impressions(s) / ED Diagnoses   Final diagnoses:  SBO (small bowel obstruction) [facility])    ED Discharge Orders    None       Dina Rich, Barbette Hair, MD 12/15/17 [phone]

## 2017-12-15 NOTE — H&P (Addendum)
TRH H&P   Patient Demographics:    Bobby Harper, is a 72 y.o. male  MRN: [id number]   DOB - Aug 02, 1946  Admit Date - 12/14/2017  Outpatient Primary MD for the patient is Birdie Riddle Aundra Millet, MD  Referring MD/NP/PA:   Antony Blackbird  Outpatient Specialists:    GI (?[place])  Patient coming from: home  Chief Complaint  Patient presents with  . Abdominal Pain      HPI:    Bobby Harper  is a 72 y.o. male, w BPH, c/o abd pain starting Saturday at noon, periumbilical, generalized. Was bloated.  Last nite his abdomen was more distended, and constant pain and therefore he presented.  Denies fever, chills, n/v, diarrhea, brbpr, black stool. Pt denies any prior abdominal surgery.   Pt presented to ED for evaluation  In ED,  Wbc 4.6, Hgb 14.0, Plt 134 Bun 20, Creatinine 1.28 Glucose 101  CT scan abd/ pelvis IMPRESSION: 1. Diffuse dilatation of small bowel to the level of distal ileum compatible with small bowel obstruction. No perforation at this time. 2. Long segment of small bowel in the lower mid abdomen with wall thickening may represent infectious or inflammatory enteritis. Small bowel is distended both upstream and downstream. 3. Small right pleural effusion. 4. Small volume of ascites. 5. Aortic atherosclerosis. 6. 4 mm nodule in right middle lobe. No follow-up needed if patient is low-risk. Non-contrast chest CT can be considered in 12 months if patient is high-risk. This recommendation follows the consensus statement: Guidelines for Management of Incidental Pulmonary Nodules Detected on CT Images: From the Fleischner Society 2017; Radiology 2017; 284:228-243.  Pt will be admitted for SBO and 14mm nodule in the right middle lobe (repeat scan needed in 12 months)    Review of systems:    In addition to the HPI above, No Fever-chills, No Headache, No changes with  Vision or hearing, No problems swallowing food or Liquids, No Chest pain, Cough or Shortness of Breath, No Nausea or Vommitting   No Blood in stool or Urine, No dysuria, No new skin rashes or bruises, No new joints pains-aches,  No new weakness, tingling, numbness in any extremity, No recent weight gain or loss, No polyuria, polydypsia or polyphagia, No significant Mental Stressors.  A full 10 point Review of Systems was done, except as stated above, all other Review of Systems were negative.   With Past History of the following :    Past Medical History:  Diagnosis Date  . Amyloidosis ([place])   . Arthritis 06-23-13   osteoarthritis right hip/some left shoulder pain  . BPH (benign prostatic hyperplasia)   . Congestive heart failure (CHF) ([place])   . Right hip pain    FOLLOWS DR. FOR ARTHRITIS      Past Surgical History:  Procedure Laterality Date  . REFRACTIVE SURGERY    . TOTAL HIP ARTHROPLASTY Right 06/29/2013  Procedure: RIGHT TOTAL HIP ARTHROPLASTY ANTERIOR APPROACH;  Surgeon: Mauri Pole, MD;  Location: [facility];  Service: Orthopedics;  Laterality: Right;  Marland Kitchen VASECTOMY        Social History:     Social History   Tobacco Use  . Smoking status: Never Smoker  . Smokeless tobacco: Never Used  Substance Use Topics  . Alcohol use: Yes    Comment: weekends, socially     Lives - at home  Mobility - walks by self   Family History :     Family History  Problem Relation Age of Onset  . Coronary artery disease Mother   . Hypertension Mother   . Kidney failure Mother   . Heart attack Father   . Heart disease Father 41       MI      Home Medications:   Prior to Admission medications   Medication Sig Start Date End Date Taking? Authorizing Provider  acyclovir (ZOVIRAX) 200 MG capsule Take 400 mg by mouth 2 (two) times daily.    Yes [provider]  b complex vitamins tablet Take 1 tablet by mouth daily.   Yes [provider]  bortezomib  IV (VELCADE) 3.5 MG injection Inject 1.3 mg/m2 into the vein once.   Yes [provider]  doxycycline (VIBRA-TABS) 100 MG tablet Take 100 mg by mouth 2 (two) times daily.   Yes [provider]  furosemide (LASIX) 20 MG tablet Take 1 tablet (20 mg total) by mouth daily. Patient taking differently: Take 20 mg by mouth daily as needed for edema.  08/08/17 12/14/17 Yes Park Liter, MD  gabapentin (NEURONTIN) 100 MG capsule Take 1 capsule (100 mg total) by mouth 3 (three) times daily. 10/08/17  Yes Patel, Donika K, DO  omeprazole (PRILOSEC) 20 MG capsule Take 20 mg by mouth daily.   Yes [provider]  oxymetazoline (AFRIN) 0.05 % nasal spray Place 3 sprays into both nostrils daily as needed for congestion.   Yes [provider]  potassium chloride (K-DUR) 10 MEQ tablet Take 1 tablet (10 mEq total) by mouth daily. Patient taking differently: Take 10 mEq by mouth daily as needed (Takes with Lasix).  08/08/17 12/14/17 Yes Park Liter, MD  gabapentin (NEURONTIN) 300 MG capsule Take 1 capsule (300 mg total) by mouth at bedtime. Patient not taking: Reported on 12/15/2017 11/04/17   Narda Amber K, DO     Allergies:     Allergies  Allergen Reactions  . Sulfasalazine Other (See Comments)    Years ago, lips and gums broke out in blisters  . Sulfonamide Derivatives Other (See Comments)    Years ago, lips and gums broke out in blisters  . Meloxicam Hives    On 2 separate occasions noted.     Physical Exam:   Vitals  Blood pressure 119/75, pulse 83, temperature 98.3 F (36.8 C), temperature source Oral, resp. rate 20, weight 99.8 kg (220 lb), SpO2 96 %.   1. General lying in bed in NAD,   2. Normal affect and insight, Not Suicidal or Homicidal, Awake Alert, Oriented X 3.  3. No F.N deficits, ALL C.Nerves Intact, Strength 5/5 all 4 extremities, Sensation intact all 4 extremities, Plantars down going.  4. Ears and Eyes appear Normal, Conjunctivae  clear, PERRLA. Moist Oral Mucosa.  5. Supple Neck, No JVD, No cervical lymphadenopathy appriciated, No Carotid Bruits.  6. Symmetrical Chest wall movement, Good air movement bilaterally, CTAB.  7. RRR, No Gallops,  Rubs or Murmurs, No Parasternal Heave.  8. Positive Bowel Sounds, Abdomen Soft, No tenderness, No organomegaly appriciated,No rebound -guarding or rigidity.  9.  No Cyanosis, Normal Skin Turgor, No Skin Rash or Bruise.  10. Good muscle tone,  joints appear normal , no effusions, Normal ROM.  11. No Palpable Lymph Nodes in Neck or Axillae     Data Review:    CBC Recent Labs  Lab 12/14/17 1250  WBC 4.6  HGB 14.0  HCT 41.0  PLT 134*  MCV 91.9  MCH 31.4  MCHC 34.1  RDW 14.4  LYMPHSABS 0.8  MONOABS 0.5  EOSABS 0.0  BASOSABS 0.0   ------------------------------------------------------------------------------------------------------------------  Chemistries  Recent Labs  Lab 12/14/17 1250  NA 137  K 4.1  CL 103  CO2 25  GLUCOSE 101*  BUN 20  CREATININE 1.28*  CALCIUM 8.9   ------------------------------------------------------------------------------------------------------------------ estimated creatinine clearance is 66.8 mL/min (A) (by C-G formula based on SCr of 1.28 mg/dL (H)). ------------------------------------------------------------------------------------------------------------------ No results for input(s): TSH, T4TOTAL, T3FREE, THYROIDAB in the last 72 hours.  Invalid input(s): FREET3  Coagulation profile No results for input(s): INR, PROTIME in the last 168 hours. ------------------------------------------------------------------------------------------------------------------- No results for input(s): DDIMER in the last 72 hours. -------------------------------------------------------------------------------------------------------------------  Cardiac Enzymes No results for input(s): CKMB, TROPONINI, MYOGLOBIN in the last 168  hours.  Invalid input(s): CK ------------------------------------------------------------------------------------------------------------------    Component Value Date/Time   BNP 491.5 (H) 08/19/2017 0933     ---------------------------------------------------------------------------------------------------------------  Urinalysis    Component Value Date/Time   COLORURINE YELLOW 12/15/2017 1445   APPEARANCEUR CLEAR 12/15/2017 1445   LABSPEC >1.030 (H) 12/15/2017 1445   PHURINE 5.5 12/15/2017 1445   GLUCOSEU NEGATIVE 12/15/2017 1445   HGBUR NEGATIVE 12/15/2017 1445   BILIRUBINUR NEGATIVE 12/15/2017 1445   KETONESUR NEGATIVE 12/15/2017 1445   PROTEINUR 100 (A) 12/15/2017 1445   UROBILINOGEN 0.2 06/23/2013 1515   NITRITE NEGATIVE 12/15/2017 1445   LEUKOCYTESUR NEGATIVE 12/15/2017 1445    ----------------------------------------------------------------------------------------------------------------   Imaging Results:    Ct Abdomen Pelvis W Contrast  Result Date: 12/15/2017 CLINICAL DATA:  72 y/o  M; small-bowel obstruction. EXAM: CT ABDOMEN AND PELVIS WITH CONTRAST TECHNIQUE: Multidetector CT imaging of the abdomen and pelvis was performed using the standard protocol following bolus administration of intravenous contrast. CONTRAST:  173mL ISOVUE-300 IOPAMIDOL (ISOVUE-300) INJECTION 61% COMPARISON:  None. FINDINGS: Lower chest: 4 mm right middle lobe nodule (series 2, image 4). Small right pleural effusion. Hepatobiliary: No focal liver abnormality is seen. No gallstones, gallbladder wall thickening, or biliary dilatation. Pancreas: Unremarkable. No pancreatic ductal dilatation or surrounding inflammatory changes. Spleen: Normal in size without focal abnormality. Adrenals/Urinary Tract: Normal adrenal glands. Left kidney interpolar 22 mm cyst. No hydronephrosis. Mild bladder distention. Stomach/Bowel: Diffuse dilated small bowel to the level of distal ileum. Long segment of small bowel  in the mid lower abdomen with wall thickening. Normal appearance of the colon. No evidence for perforation or abscess at this time. Vascular/Lymphatic: Aortic atherosclerosis. No enlarged abdominal or pelvic lymph nodes. Reproductive: Prostate is unremarkable. Other: Small volume of peritoneal ascites. Musculoskeletal: Right total hip prosthesis is partially visualized. No acute osseous abnormality identified. IMPRESSION: 1. Diffuse dilatation of small bowel to the level of distal ileum compatible with small bowel obstruction. No perforation at this time. 2. Long segment of small bowel in the lower mid abdomen with wall thickening may represent infectious or inflammatory enteritis. Small bowel is distended both upstream and downstream. 3. Small right pleural effusion. 4. Small volume of ascites. 5. Aortic  atherosclerosis. 6. 4 mm nodule in right middle lobe. No follow-up needed if patient is low-risk. Non-contrast chest CT can be considered in 12 months if patient is high-risk. This recommendation follows the consensus statement: Guidelines for Management of Incidental Pulmonary Nodules Detected on CT Images: From the Fleischner Society 2017; Radiology 2017; 284:228-243. Electronically Signed   By: Kristine Garbe M.D.   On: 12/15/2017 03:29   Dg Abdomen Acute W/chest  Result Date: 12/15/2017 CLINICAL DATA:  72 y/o M; abdominal pain, nausea, vomiting, constipation for 2 days. EXAM: DG ABDOMEN ACUTE W/ 1V CHEST COMPARISON:  None. FINDINGS: Right small pleural effusion. Right basilar opacity. Otherwise clear lungs. Mildly enlarged cardiac silhouette. Small bowel obstruction.  Mild degenerative changes of lumbar spine. IMPRESSION: 1. Small bowel obstruction. 2. Small right pleural effusion and basilar opacity probably representing associated atelectasis. 3. Mild cardiomegaly. Electronically Signed   By: Kristine Garbe M.D.   On: 12/15/2017 01:30      Assessment & Plan:    Principal  Problem:   SBO (small bowel obstruction) (HCC)   SBO NPO except medications NGT to low intermittent suction Surgery consulted, appreciate input  ? Enteritis Due to lack of wbc and since he has been in ER for about 24 hours and symptoms are improving will defer on Abx.  Surgery consulted, discussed case with surgery, appreciate input  28mm nodule in right middle lung Consider repeat CT scan lung in 12 months  Amyloidosis Continue acyclovir Cont doxycycline Pt typically gets his chemo at [facility] on Thursday  H/o CHF(diastolic) Using gentle hydration, monitor I/O     DVT Prophylaxis Lovenox - SCDs  AM Labs Ordered, also please review Full Orders  Family Communication: Admission, patients condition and plan of care including tests being ordered have been discussed with the patient  who indicate understanding and agree with the plan and Code Status.  Code Status FULL CODE  Likely DC to  home  Condition GUARDED    Consults called: surgery (d/w Ger[place])  Admission status: inpatient  Time spent in minutes : 45   Jani Gravel M.D on 12/15/2017 at 8:28 PM  Between 7am to 7pm - Pager - [phone]    After 7pm go to [email or web address] - password [facility]  [facility] - Office  [phone]

## 2017-12-15 NOTE — ED Notes (Addendum)
NGT placed. Tolerated well. No negative changes. Wife at [facility]. Pt reports immediate relief. Pt to CT.

## 2017-12-15 NOTE — ED Notes (Signed)
MD is aware that patient wants to know if it is a possibility that he can be transferred to [facility] since his cancer doctor is at [facility].  Informed patient and wife stated MD will be here to talk to them after MD sees his 2 other patients.

## 2017-12-15 NOTE — ED Notes (Addendum)
Spoke with bed placement to check on bed status, no assigned bed. Spoke to ED MD about possibility of transfer to [facility], per wife request due to having specialist  at [facility]

## 2017-12-15 NOTE — ED Notes (Signed)
EDP into room, prior to RN assessment, see MD notes, pending orders.   

## 2017-12-16 DIAGNOSIS — E859 Amyloidosis, unspecified: Secondary | ICD-10-CM

## 2017-12-16 DIAGNOSIS — K56609 Unspecified intestinal obstruction, unspecified as to partial versus complete obstruction: Principal | ICD-10-CM

## 2017-12-16 LAB — COMPREHENSIVE METABOLIC PANEL
ALT: 30 U/L (ref 17–63)
ANION GAP: 6 (ref 5–15)
AST: 24 U/L (ref 15–41)
Albumin: 3.1 g/dL — ABNORMAL LOW (ref 3.5–5.0)
Alkaline Phosphatase: 98 U/L (ref 38–126)
BILIRUBIN TOTAL: 1.8 mg/dL — AB (ref 0.3–1.2)
BUN: 17 mg/dL (ref 6–20)
CO2: 26 mmol/L (ref 22–32)
Calcium: 8.6 mg/dL — ABNORMAL LOW (ref 8.9–10.3)
Chloride: 107 mmol/L (ref 101–111)
Creatinine, Ser: 1.12 mg/dL (ref 0.61–1.24)
Glucose, Bld: 83 mg/dL (ref 65–99)
POTASSIUM: 3.9 mmol/L (ref 3.5–5.1)
Sodium: 139 mmol/L (ref 135–145)
TOTAL PROTEIN: 5.4 g/dL — AB (ref 6.5–8.1)

## 2017-12-16 LAB — CBC
HCT: 37 % — ABNORMAL LOW (ref 39.0–52.0)
HEMOGLOBIN: 12.5 g/dL — AB (ref 13.0–17.0)
MCH: 31.4 pg (ref 26.0–34.0)
MCHC: 33.8 g/dL (ref 30.0–36.0)
MCV: 93 fL (ref 78.0–100.0)
Platelets: 120 10*3/uL — ABNORMAL LOW (ref 150–400)
RBC: 3.98 MIL/uL — AB (ref 4.22–5.81)
RDW: 14.4 % (ref 11.5–15.5)
WBC: 3.2 10*3/uL — AB (ref 4.0–10.5)

## 2017-12-16 MED ORDER — DOXYCYCLINE HYCLATE 100 MG PO TABS
100.0000 mg | ORAL_TABLET | Freq: Two times a day (BID) | ORAL | Status: DC
  Administered 2017-12-16 – 2017-12-17 (×2): 100 mg via ORAL
  Filled 2017-12-16 (×2): qty 1

## 2017-12-16 MED ORDER — GABAPENTIN 400 MG PO CAPS
400.0000 mg | ORAL_CAPSULE | Freq: Every day | ORAL | Status: DC
  Administered 2017-12-16 – 2017-12-17 (×2): 400 mg via ORAL
  Filled 2017-12-16 (×2): qty 1

## 2017-12-16 MED ORDER — ZOLPIDEM TARTRATE 5 MG PO TABS
5.0000 mg | ORAL_TABLET | Freq: Once | ORAL | Status: AC
  Administered 2017-12-17: 5 mg via ORAL
  Filled 2017-12-16 (×2): qty 1

## 2017-12-16 MED ORDER — SALINE SPRAY 0.65 % NA SOLN
1.0000 | NASAL | Status: DC | PRN
  Filled 2017-12-16: qty 44

## 2017-12-16 MED ORDER — GABAPENTIN 400 MG PO CAPS
500.0000 mg | ORAL_CAPSULE | ORAL | Status: DC
  Administered 2017-12-16 – 2017-12-17 (×2): 500 mg via ORAL
  Filled 2017-12-16 (×3): qty 1

## 2017-12-16 NOTE — Consult Note (Signed)
[facility] Surgery Consult/Admission Note  Bobby Harper 1946-02-25  [id number].    Requesting MD: Dr. Dyann Kief Chief Complaint/Reason for Consult: SBO  HPI:  Pt is a 72 year old male with a recent diagnosis of AL amyloidosis currently being treated with VCD + doxy weekly at [facility], neuropathy, BLE edema and CHF who presented to the [facility] ED with complaints of abdominal pain, nausea and bloating for roughly 2 days. Pt states on 1/5 he began having abdominal pain and bloating. It progressively worsened to constant, severe, nonradiating abdominal pain. He states he felt if he vomited he would feel better. He did not vomit. Pt states his last BM was 01/03. He states no flatus since his last BM until today. Pt states he has had flatus today. He currently denies abdominal pain, bloating, or nausea. No NGT out put recorded since yesterday. He denies fever, chills, diarrhea, dark stools, vomiting. CT scan showed dilated small bowel to the level of distal ileum and long segment of small bowel in the mid lower abdomen with wall thickening. No evidence of perforation. No history of abdominal surgeries.  ROS:  Review of Systems  Constitutional: Negative for chills, diaphoresis and fever.  HENT: Negative for sore throat.   Respiratory: Negative for cough.   Cardiovascular: Negative for chest pain.  Gastrointestinal: Positive for abdominal pain and nausea. Negative for diarrhea and vomiting.  Skin: Negative for rash.  Neurological: Negative for dizziness and loss of consciousness.  All other systems reviewed and are negative.    Family History  Problem Relation Age of Onset  . Coronary artery disease Mother   . Hypertension Mother   . Kidney failure Mother   . Heart attack Father   . Heart disease Father 76       MI    Past Medical History:  Diagnosis Date  . Amyloidosis ([place])   . Arthritis 06-23-13   osteoarthritis right hip/some left shoulder pain  . BPH (benign  prostatic hyperplasia)   . Congestive heart failure (CHF) ([place])   . Right hip pain    FOLLOWS DR. FOR ARTHRITIS    Past Surgical History:  Procedure Laterality Date  . REFRACTIVE SURGERY    . TOTAL HIP ARTHROPLASTY Right 06/29/2013   Procedure: RIGHT TOTAL HIP ARTHROPLASTY ANTERIOR APPROACH;  Surgeon: Mauri Pole, MD;  Location: [facility]S;  Service: Orthopedics;  Laterality: Right;  Marland Kitchen VASECTOMY      Social History:  reports that  has never smoked. he has never used smokeless tobacco. He reports that he drinks alcohol. He reports that he does not use drugs.  Allergies:  Allergies  Allergen Reactions  . Sulfasalazine Other (See Comments)    Years ago, lips and gums broke out in blisters  . Sulfonamide Derivatives Other (See Comments)    Years ago, lips and gums broke out in blisters  . Meloxicam Hives    On 2 separate occasions noted.    Medications Prior to Admission  Medication Sig Dispense Refill  . acyclovir (ZOVIRAX) 200 MG capsule Take 400 mg by mouth 2 (two) times daily.     Marland Kitchen b complex vitamins tablet Take 1 tablet by mouth daily.    . bortezomib IV (VELCADE) 3.5 MG injection Inject 1.3 mg/m2 into the vein once.    . doxycycline (VIBRA-TABS) 100 MG tablet Take 100 mg by mouth 2 (two) times daily.    . furosemide (LASIX) 20 MG tablet Take 1 tablet (20 mg total) by mouth daily. (  Patient taking differently: Take 20 mg by mouth daily as needed for edema. ) 30 tablet 6  . gabapentin (NEURONTIN) 100 MG capsule Take 1 capsule (100 mg total) by mouth 3 (three) times daily. 90 capsule 5  . omeprazole (PRILOSEC) 20 MG capsule Take 20 mg by mouth daily.    Marland Kitchen oxymetazoline (AFRIN) 0.05 % nasal spray Place 3 sprays into both nostrils daily as needed for congestion.    . potassium chloride (K-DUR) 10 MEQ tablet Take 1 tablet (10 mEq total) by mouth daily. (Patient taking differently: Take 10 mEq by mouth daily as needed (Takes with Lasix). ) 30 tablet 6  . gabapentin (NEURONTIN) 300 MG  capsule Take 1 capsule (300 mg total) by mouth at bedtime. (Patient not taking: Reported on 12/15/2017) 90 capsule 5    Blood pressure 133/69, pulse 73, temperature 98.3 F (36.8 C), temperature source Oral, resp. rate 14, weight 220 lb (99.8 kg), SpO2 96 %.  Physical Exam  Constitutional: He is oriented to person, place, and time and well-developed, well-nourished, and in no distress. No distress.  HENT:  Head: Normocephalic and atraumatic.  Nose: Nose normal.  Eyes: Conjunctivae are normal. Right eye exhibits no discharge. Left eye exhibits no discharge. No scleral icterus.  Pupils are round and equal  Neck: Normal range of motion. Neck supple. No thyromegaly present.  Cardiovascular: Normal rate, regular rhythm and normal heart sounds.  No murmur heard. Pulses:      Radial pulses are 2+ on the right side, and 2+ on the left side.  Pulmonary/Chest: Effort normal and breath sounds normal. No accessory muscle usage. No respiratory distress. He has no wheezes. He has no rhonchi. He has no rales.  Abdominal: Soft. Normal appearance and bowel sounds are normal. He exhibits no distension. There is no hepatosplenomegaly. There is no tenderness.  Musculoskeletal: Normal range of motion. He exhibits edema (2+ of BLE). He exhibits no tenderness or deformity.  Neurological: He is alert and oriented to person, place, and time. GCS score is 15.  Skin: Skin is warm and dry. No rash noted. He is not diaphoretic.  Psychiatric: Mood and affect normal.  Nursing note and vitals reviewed.   Results for orders placed or performed during the hospital encounter of 12/14/17 (from the past 48 hour(s))  CBC with Differential     Status: Abnormal   Collection Time: 12/14/17 12:50 PM  Result Value Ref Range   WBC 4.6 4.0 - 10.5 K/uL   RBC 4.46 4.22 - 5.81 MIL/uL   Hemoglobin 14.0 13.0 - 17.0 g/dL   HCT 41.0 39.0 - 52.0 %   MCV 91.9 78.0 - 100.0 fL   MCH 31.4 26.0 - 34.0 pg   MCHC 34.1 30.0 - 36.0 g/dL    RDW 14.4 11.5 - 15.5 %   Platelets 134 (L) 150 - 400 K/uL   Neutrophils Relative % 71 %   Neutro Abs 3.3 1.7 - 7.7 K/uL   Lymphocytes Relative 18 %   Lymphs Abs 0.8 0.7 - 4.0 K/uL   Monocytes Relative 11 %   Monocytes Absolute 0.5 0.1 - 1.0 K/uL   Eosinophils Relative 0 %   Eosinophils Absolute 0.0 0.0 - 0.7 K/uL   Basophils Relative 0 %   Basophils Absolute 0.0 0.0 - 0.1 K/uL  Basic metabolic panel     Status: Abnormal   Collection Time: 12/14/17 12:50 PM  Result Value Ref Range   Sodium 137 135 - 145 mmol/L   Potassium 4.1  3.5 - 5.1 mmol/L   Chloride 103 101 - 111 mmol/L   CO2 25 22 - 32 mmol/L   Glucose, Bld 101 (H) 65 - 99 mg/dL   BUN 20 6 - 20 mg/dL   Creatinine, Ser 1.28 (H) 0.61 - 1.24 mg/dL   Calcium 8.9 8.9 - 10.3 mg/dL   GFR calc non Af Amer 55 (L) >60 mL/min   GFR calc Af Amer >60 >60 mL/min    Comment: (NOTE) The eGFR has been calculated using the CKD EPI equation. This calculation has not been validated in all clinical situations. eGFR's persistently <60 mL/min signify possible Chronic Kidney Disease.    Anion gap 9 5 - 15  Lipase, blood     Status: None   Collection Time: 12/14/17 12:50 PM  Result Value Ref Range   Lipase 38 11 - 51 U/L  Urinalysis, Routine w reflex microscopic     Status: Abnormal   Collection Time: 12/15/17  2:45 PM  Result Value Ref Range   Color, Urine YELLOW YELLOW   APPearance CLEAR CLEAR   Specific Gravity, Urine >1.030 (H) 1.005 - 1.030   pH 5.5 5.0 - 8.0   Glucose, UA NEGATIVE NEGATIVE mg/dL   Hgb urine dipstick NEGATIVE NEGATIVE   Bilirubin Urine NEGATIVE NEGATIVE   Ketones, ur NEGATIVE NEGATIVE mg/dL   Protein, ur 100 (A) NEGATIVE mg/dL   Nitrite NEGATIVE NEGATIVE   Leukocytes, UA NEGATIVE NEGATIVE  Urinalysis, Microscopic (reflex)     Status: Abnormal   Collection Time: 12/15/17  2:45 PM  Result Value Ref Range   RBC / HPF NONE SEEN 0 - 5 RBC/hpf   WBC, UA NONE SEEN 0 - 5 WBC/hpf   Bacteria, UA RARE (A) NONE SEEN    Squamous Epithelial / LPF 0-5 (A) NONE SEEN  Comprehensive metabolic panel     Status: Abnormal   Collection Time: 12/16/17  3:52 AM  Result Value Ref Range   Sodium 139 135 - 145 mmol/L   Potassium 3.9 3.5 - 5.1 mmol/L   Chloride 107 101 - 111 mmol/L   CO2 26 22 - 32 mmol/L   Glucose, Bld 83 65 - 99 mg/dL   BUN 17 6 - 20 mg/dL   Creatinine, Ser 1.12 0.61 - 1.24 mg/dL   Calcium 8.6 (L) 8.9 - 10.3 mg/dL   Total Protein 5.4 (L) 6.5 - 8.1 g/dL   Albumin 3.1 (L) 3.5 - 5.0 g/dL   AST 24 15 - 41 U/L   ALT 30 17 - 63 U/L   Alkaline Phosphatase 98 38 - 126 U/L   Total Bilirubin 1.8 (H) 0.3 - 1.2 mg/dL   GFR calc non Af Amer >60 >60 mL/min   GFR calc Af Amer >60 >60 mL/min    Comment: (NOTE) The eGFR has been calculated using the CKD EPI equation. This calculation has not been validated in all clinical situations. eGFR's persistently <60 mL/min signify possible Chronic Kidney Disease.    Anion gap 6 5 - 15  CBC     Status: Abnormal   Collection Time: 12/16/17  3:52 AM  Result Value Ref Range   WBC 3.2 (L) 4.0 - 10.5 K/uL   RBC 3.98 (L) 4.22 - 5.81 MIL/uL   Hemoglobin 12.5 (L) 13.0 - 17.0 g/dL   HCT 37.0 (L) 39.0 - 52.0 %   MCV 93.0 78.0 - 100.0 fL   MCH 31.4 26.0 - 34.0 pg   MCHC 33.8 30.0 - 36.0 g/dL   RDW  14.4 11.5 - 15.5 %   Platelets 120 (L) 150 - 400 K/uL   Ct Abdomen Pelvis W Contrast  Result Date: 12/15/2017 CLINICAL DATA:  72 y/o  M; small-bowel obstruction. EXAM: CT ABDOMEN AND PELVIS WITH CONTRAST TECHNIQUE: Multidetector CT imaging of the abdomen and pelvis was performed using the standard protocol following bolus administration of intravenous contrast. CONTRAST:  172m ISOVUE-300 IOPAMIDOL (ISOVUE-300) INJECTION 61% COMPARISON:  None. FINDINGS: Lower chest: 4 mm right middle lobe nodule (series 2, image 4). Small right pleural effusion. Hepatobiliary: No focal liver abnormality is seen. No gallstones, gallbladder wall thickening, or biliary dilatation. Pancreas:  Unremarkable. No pancreatic ductal dilatation or surrounding inflammatory changes. Spleen: Normal in size without focal abnormality. Adrenals/Urinary Tract: Normal adrenal glands. Left kidney interpolar 22 mm cyst. No hydronephrosis. Mild bladder distention. Stomach/Bowel: Diffuse dilated small bowel to the level of distal ileum. Long segment of small bowel in the mid lower abdomen with wall thickening. Normal appearance of the colon. No evidence for perforation or abscess at this time. Vascular/Lymphatic: Aortic atherosclerosis. No enlarged abdominal or pelvic lymph nodes. Reproductive: Prostate is unremarkable. Other: Small volume of peritoneal ascites. Musculoskeletal: Right total hip prosthesis is partially visualized. No acute osseous abnormality identified. IMPRESSION: 1. Diffuse dilatation of small bowel to the level of distal ileum compatible with small bowel obstruction. No perforation at this time. 2. Long segment of small bowel in the lower mid abdomen with wall thickening may represent infectious or inflammatory enteritis. Small bowel is distended both upstream and downstream. 3. Small right pleural effusion. 4. Small volume of ascites. 5. Aortic atherosclerosis. 6. 4 mm nodule in right middle lobe. No follow-up needed if patient is low-risk. Non-contrast chest CT can be considered in 12 months if patient is high-risk. This recommendation follows the consensus statement: Guidelines for Management of Incidental Pulmonary Nodules Detected on CT Images: From the Fleischner Society 2017; Radiology 2017; 284:228-243. Electronically Signed   By: LKristine GarbeM.D.   On: 12/15/2017 03:29   Dg Abdomen Acute W/chest  Result Date: 12/15/2017 CLINICAL DATA:  72y/o M; abdominal pain, nausea, vomiting, constipation for 2 days. EXAM: DG ABDOMEN ACUTE W/ 1V CHEST COMPARISON:  None. FINDINGS: Right small pleural effusion. Right basilar opacity. Otherwise clear lungs. Mildly enlarged cardiac silhouette.  Small bowel obstruction.  Mild degenerative changes of lumbar spine. IMPRESSION: 1. Small bowel obstruction. 2. Small right pleural effusion and basilar opacity probably representing associated atelectasis. 3. Mild cardiomegaly. Electronically Signed   By: LKristine GarbeM.D.   On: 12/15/2017 01:30      Assessment/Plan Principal Problem:   SBO (small bowel obstruction) (HCC) Active Problems:   Amyloidosis (HCC)  SBO - pt is having flatus and no NGT output recorded since yesterday. Spoke with nurse this AM and she is unsure if there was output overnight.  - since pt is having flatus will pull NGT and start on clears. Pt is not clinically obstructed at this point. This could be 2/2 enteritis with small bowel wall thickening seen on CT. Will advance diet slowly. We will follow along. Thank you for the consult.   JKalman Drape PTex[facility] Center[facility] 12/16/2017, 8:26 AM Pager: [phone]: [phone]-Fri 7:00 am-4:30 pm Sat-Sun 7:00 am-11:30 am

## 2017-12-16 NOTE — Progress Notes (Signed)
[facility] PROGRESS NOTE  Bobby Harper SWN:[id number] DOB: 1946/10/19 DOA: 12/14/2017 PCP: Midge Minium, MD  Interim summary and HPI 72 y.o. male, w hc of amyloidosis, GERD, neuropathy and chronic diastolic HF; who presented with complaints of abd pain, nausea and vomiting. Patient's symptoms present since Saturday and worsening. Found to have SBO on work up and was admitted for further evaluation and treatment.   Assessment/Plan: 1-SBO -w/o frank obstruction appreciated on scan -improved with NGT placement -no further vomiting and denies pain -per CCS rec's will most likely remove NG and slowly advance diet  -continue PRN antiemetics   2-amyloidosis  -continue outpatient follow up at [facility] -actively receiving chemotherapy -continue doxycycline and valterx for prophylaxis   3-neuropathy -continue neurontin -dose adjusted as per home regimen   4-GERD -will resume PPI  5-chronic hx of diastolic HF -compensated -holding lasix for now -gentle hydration provided -will follow weight and I's/O's   Code Status: Full Family Communication: wife Disposition Plan: remains inpatient; planning to remove NGT and advance diet slowly.   Consultants:  General surgery   Procedures:  See below for x-ray reports   Antibiotics/antiviral agents:  Chronically on doxycycline and Valtrex   HPI/Subjective: Afebrile, no CP, no SOB, no further vomiting and feeling much better overall. Reporting still some nausea. No BM's, but with positive flatus.  Objective: Vitals:   12/16/17 0429 12/16/17 1230  BP: 133/69 128/71  Pulse: 73 86  Resp: 14 16  Temp: 98.3 F (36.8 C) 97.6 F (36.4 C)  SpO2: 96% 97%    Intake/Output Summary (Last 24 hours) at 12/16/2017 2156 Last data filed at 12/16/2017 1230 Gross per 24 hour  Intake 1196.25 ml  Output 805 ml  Net 391.25 ml   Filed Weights   12/14/17 2342  Weight: 99.8 kg (220 lb)    Exam:   General: afebrile, no further  episodes of vomitting and reporting improvement in abd distension and nausea; even he still has some nausea. Patient with clamp NGT in place.  Cardiovascular: S1 and S2, no rubs, no gallops  Respiratory: CTA bilaterally  Abdomen: soft, NT, no tenderness or masses appreciated. Positive BS  Musculoskeletal: trace edema bilaterally, no cyanosis   Data Reviewed: Basic Metabolic Panel: Recent Labs  Lab 12/14/17 1250 12/16/17 0352  NA 137 139  K 4.1 3.9  CL 103 107  CO2 25 26  GLUCOSE 101* 83  BUN 20 17  CREATININE 1.28* 1.12  CALCIUM 8.9 8.6*   Liver Function Tests: Recent Labs  Lab 12/16/17 0352  AST 24  ALT 30  ALKPHOS 98  BILITOT 1.8*  PROT 5.4*  ALBUMIN 3.1*   Recent Labs  Lab 12/14/17 1250  LIPASE 38   CBC: Recent Labs  Lab 12/14/17 1250 12/16/17 0352  WBC 4.6 3.2*  NEUTROABS 3.3  --   HGB 14.0 12.5*  HCT 41.0 37.0*  MCV 91.9 93.0  PLT 134* 120*   BNP (last 3 results) Recent Labs    08/19/17 0933  BNP 491.5*    ProBNP (last 3 results) Recent Labs    08/07/17 1044  PROBNP 2,046*    Studies: Ct Abdomen Pelvis W Contrast  Result Date: 12/15/2017 CLINICAL DATA:  72 y/o  M; small-bowel obstruction. EXAM: CT ABDOMEN AND PELVIS WITH CONTRAST TECHNIQUE: Multidetector CT imaging of the abdomen and pelvis was performed using the standard protocol following bolus administration of intravenous contrast. CONTRAST:  174mL ISOVUE-300 IOPAMIDOL (ISOVUE-300) INJECTION 61% COMPARISON:  None. FINDINGS: Lower chest: 4 mm  right middle lobe nodule (series 2, image 4). Small right pleural effusion. Hepatobiliary: No focal liver abnormality is seen. No gallstones, gallbladder wall thickening, or biliary dilatation. Pancreas: Unremarkable. No pancreatic ductal dilatation or surrounding inflammatory changes. Spleen: Normal in size without focal abnormality. Adrenals/Urinary Tract: Normal adrenal glands. Left kidney interpolar 22 mm cyst. No hydronephrosis. Mild bladder  distention. Stomach/Bowel: Diffuse dilated small bowel to the level of distal ileum. Long segment of small bowel in the mid lower abdomen with wall thickening. Normal appearance of the colon. No evidence for perforation or abscess at this time. Vascular/Lymphatic: Aortic atherosclerosis. No enlarged abdominal or pelvic lymph nodes. Reproductive: Prostate is unremarkable. Other: Small volume of peritoneal ascites. Musculoskeletal: Right total hip prosthesis is partially visualized. No acute osseous abnormality identified. IMPRESSION: 1. Diffuse dilatation of small bowel to the level of distal ileum compatible with small bowel obstruction. No perforation at this time. 2. Long segment of small bowel in the lower mid abdomen with wall thickening may represent infectious or inflammatory enteritis. Small bowel is distended both upstream and downstream. 3. Small right pleural effusion. 4. Small volume of ascites. 5. Aortic atherosclerosis. 6. 4 mm nodule in right middle lobe. No follow-up needed if patient is low-risk. Non-contrast chest CT can be considered in 12 months if patient is high-risk. This recommendation follows the consensus statement: Guidelines for Management of Incidental Pulmonary Nodules Detected on CT Images: From the Fleischner Society 2017; Radiology 2017; 284:228-243. Electronically Signed   By: Kristine Garbe M.D.   On: 12/15/2017 03:29   Dg Abdomen Acute W/chest  Result Date: 12/15/2017 CLINICAL DATA:  72 y/o M; abdominal pain, nausea, vomiting, constipation for 2 days. EXAM: DG ABDOMEN ACUTE W/ 1V CHEST COMPARISON:  None. FINDINGS: Right small pleural effusion. Right basilar opacity. Otherwise clear lungs. Mildly enlarged cardiac silhouette. Small bowel obstruction.  Mild degenerative changes of lumbar spine. IMPRESSION: 1. Small bowel obstruction. 2. Small right pleural effusion and basilar opacity probably representing associated atelectasis. 3. Mild cardiomegaly. Electronically  Signed   By: Kristine Garbe M.D.   On: 12/15/2017 01:30    Scheduled Meds: . acyclovir  400 mg Oral BID  . B-complex with vitamin C  1 tablet Oral Daily  . doxycycline  100 mg Oral Q12H  . enoxaparin (LOVENOX) injection  40 mg Subcutaneous Q24H  . gabapentin  400 mg Oral Daily  . gabapentin  500 mg Oral 2 times per day  . pantoprazole  40 mg Oral Daily  . zolpidem  5 mg Oral Once    Time spent: 30 minutes    [facility] Pager [phone]. If 7PM-7AM, please contact night-coverage at [email or web address], password [facility] 12/16/2017, 9:56 PM  LOS: 1 day

## 2017-12-17 DIAGNOSIS — I5032 Chronic diastolic (congestive) heart failure: Secondary | ICD-10-CM

## 2017-12-17 DIAGNOSIS — K219 Gastro-esophageal reflux disease without esophagitis: Secondary | ICD-10-CM

## 2017-12-17 DIAGNOSIS — G629 Polyneuropathy, unspecified: Secondary | ICD-10-CM

## 2017-12-17 LAB — BASIC METABOLIC PANEL WITH GFR
Anion gap: 8 (ref 5–15)
BUN: 14 mg/dL (ref 6–20)
CO2: 23 mmol/L (ref 22–32)
Calcium: 8.4 mg/dL — ABNORMAL LOW (ref 8.9–10.3)
Chloride: 106 mmol/L (ref 101–111)
Creatinine, Ser: 1.08 mg/dL (ref 0.61–1.24)
GFR calc Af Amer: >60 mL/min
GFR calc non Af Amer: >60 mL/min
Glucose, Bld: 84 mg/dL (ref 65–99)
Potassium: 4.4 mmol/L (ref 3.5–5.1)
Sodium: 137 mmol/L (ref 135–145)

## 2017-12-17 LAB — CBC
HCT: 37.1 % — ABNORMAL LOW (ref 39.0–52.0)
Hemoglobin: 12.6 g/dL — ABNORMAL LOW (ref 13.0–17.0)
MCH: 31.5 pg (ref 26.0–34.0)
MCHC: 34 g/dL (ref 30.0–36.0)
MCV: 92.8 fL (ref 78.0–100.0)
Platelets: 85 K/uL — ABNORMAL LOW (ref 150–400)
RBC: 4 MIL/uL — ABNORMAL LOW (ref 4.22–5.81)
RDW: 14.1 % (ref 11.5–15.5)
WBC: 3.9 K/uL — ABNORMAL LOW (ref 4.0–10.5)

## 2017-12-17 MED ORDER — POTASSIUM CHLORIDE ER 10 MEQ PO TBCR: 10.0000 meq | EXTENDED_RELEASE_TABLET | Freq: Every day | ORAL | Status: AC | PRN

## 2017-12-17 MED ORDER — FUROSEMIDE 20 MG PO TABS: 20.0000 mg | ORAL_TABLET | Freq: Every day | ORAL | Status: AC | PRN

## 2017-12-17 NOTE — Discharge Instructions (Signed)
Soft-Food Meal Plan Follow for one week after discharge Eat small frequent meals and avoid large heavy meals A soft-food meal plan includes foods that are safe and easy to swallow. This meal plan typically is used:  If you are having trouble chewing or swallowing foods.  As a transition meal plan after only having had liquid meals for a long period.  What do I need to know about the soft-food meal plan? A soft-food meal plan includes tender foods that are soft and easy to chew and swallow. In most cases, bite-sized pieces of food are easier to swallow. A bite-sized piece is about  inch or smaller. Foods in this plan do not need to be ground or pureed. Foods that are very hard, crunchy, or sticky should be avoided. Also, breads, cereals, yogurts, and desserts with nuts, seeds, or fruits should be avoided. What foods can I eat? Grains Rice and wild rice. Moist bread, dressing, pasta, and noodles. Well-moistened dry or cooked cereals, such as farina (cooked wheat cereal), oatmeal, or grits. Biscuits, breads, muffins, pancakes, and waffles that have been well moistened. Vegetables Shredded lettuce. Cooked, tender vegetables, including potatoes without skins. Vegetable juices. Broths or creamed soups made with vegetables that are not stringy or chewy. Strained tomatoes (without seeds). Fruits Canned or well-cooked fruits. Soft (ripe), peeled fresh fruits, such as peaches, nectarines, kiwi, cantaloupe, honeydew melon, and watermelon (without seeds). Soft berries with small seeds, such as strawberries. Fruit juices (without pulp). Meats and Other Protein Sources Moist, tender, lean beef. Mutton. Lamb. Veal. Chicken. [place]. Liver. Ham. Fish without bones. Eggs. Dairy Milk, milk drinks, and cream. Plain cream cheese and cottage cheese. Plain yogurt. Sweets/Desserts Flavored gelatin desserts. Custard. Plain ice cream, frozen yogurt, sherbet, milk shakes, and malts. Plain cakes and cookies. Plain hard  candy. Other Butter, margarine (without trans fat), and cooking oils. Mayonnaise. Cream sauces. Mild spices, salt, and sugar. Syrup, molasses, honey, and jelly. The items listed above may not be a complete list of recommended foods or beverages. Contact your dietitian for more options. What foods are not recommended? Grains Dry bread, toast, crackers that have not been moistened. Coarse or dry cereals, such as bran, granola, and shredded wheat. Tough or chewy crusty breads, such as [place] bread or baguettes. Vegetables Corn. Raw vegetables except shredded lettuce. Cooked vegetables that are tough or stringy. Tough, crisp, fried potatoes and potato skins. Fruits Fresh fruits with skins or seeds or both, such as apples, pears, or grapes. Stringy, high-pulp fruits, such as papaya, pineapple, coconut, or mango. Fruit leather, fruit roll-ups, and all dried fruits. Meats and Other Protein Sources Sausages and hot dogs. Meats with gristle. Fish with bones. Nuts, seeds, and chunky peanut or other nut butters. Sweets/Desserts Cakes or cookies that are very dry or chewy. The items listed above may not be a complete list of foods and beverages to avoid. Contact your dietitian for more information. This information is not intended to replace advice given to you by your health care provider. Make sure you discuss any questions you have with your health care provider. Document Released: 03/03/2008 Document Revised: 05/02/2016 Document Reviewed: 10/22/2013 Elsevier Interactive Patient Education  2017 [employer].

## 2017-12-17 NOTE — Discharge Summary (Signed)
Physician Discharge Summary  Bobby Harper:[id number] DOB: 13-Sep-1946 DOA: 12/14/2017  PCP: Midge Minium, MD  Admit date: 12/14/2017 Discharge date: 12/17/2017  Time spent: 35 minutes  Recommendations for Outpatient Follow-up:  Repeat BMET to follow electrolytes and renal function   Discharge Diagnoses:  Principal Problem:   SBO (small bowel obstruction) ([place]) Active Problems:   Amyloidosis ([place])   Chronic diastolic CHF (congestive heart failure) ([place])   Neuropathy   Gastroesophageal reflux disease   Discharge Condition: stable and improved. Patient instructed to follow up with PCP in 10 days.   Diet recommendation: heart healthy and soft diet (at least for 10 days, as recommended by general surgery)  Filed Weights   12/14/17 2342 12/17/17 0634  Weight: 99.8 kg (220 lb) 101.6 kg (224 lb)    History of present illness:  72 y.o.male,w hc of amyloidosis, GERD, neuropathy and chronic diastolic HF; who presented with complaints of abd pain, nausea and vomiting. Patient's symptoms present since Saturday and worsening. Found to have SBO on work up and was admitted for further evaluation and treatment.   Hospital Course:  1-SBO -w/o frank obstruction appreciated on scan -no further vomiting, denies pain and has tolerated soft diet after removing NGT. -patients condition resolved with conservative management   2-amyloidosis  -continue outpatient follow up at [facility] -actively receiving chemotherapy -continue doxycycline and valterx for prophylaxis   3-neuropathy -continue neurontin as per home regimen   4-GERD -will resume PPI  5-chronic hx of diastolic HF -compensated -gentle hydration provided -at discharge instructed to resume lasix regimen  -patient also educated about importance on low sodium diet and daily weights    Procedures:  See below for x-ray reports   Consultations:  General surgery   Discharge Exam: Vitals:   12/16/17 2209 12/17/17  0634  BP: 110/64 126/80  Pulse: 74 76  Resp: 16 16  Temp: 98.4 F (36.9 C) 98.2 F (36.8 C)  SpO2: 91% 96%     General: afebrile, no further episodes of vomitting and reporting improvement in abd distension and resolution of nausea. Patient tolerating soft diet and PO meds w/o difficulties   Cardiovascular: S1 and S2, no rubs, no gallops  Respiratory: CTA bilaterally  Abdomen: soft, NT, no tenderness or masses appreciated. Positive BS  Musculoskeletal: trace edema bilaterally, no cyanosis, no clubbing    Discharge Instructions   Discharge Instructions    Diet - low sodium heart healthy   Complete by:  As directed    Discharge instructions   Complete by:  As directed    Keep yourself well-hydrated Follow-up soft low residue diet for the next 10 days Arrange follow-up with your PCP in about 10 days     Allergies as of 12/17/2017      Reactions   Sulfasalazine Other (See Comments)   Years ago, lips and gums broke out in blisters   Sulfonamide Derivatives Other (See Comments)   Years ago, lips and gums broke out in blisters   Meloxicam Hives   On 2 separate occasions noted.      Medication List    TAKE these medications   acyclovir 200 MG capsule Commonly known as:  ZOVIRAX Take 400 mg by mouth 2 (two) times daily.   b complex vitamins tablet Take 1 tablet by mouth daily.   doxycycline 100 MG tablet Commonly known as:  VIBRA-TABS Take 100 mg by mouth 2 (two) times daily.   furosemide 20 MG tablet Commonly known as:  LASIX Take 1  tablet (20 mg total) by mouth daily as needed for edema.   gabapentin 100 MG capsule Commonly known as:  NEURONTIN Take 1 capsule (100 mg total) by mouth 3 (three) times daily.   gabapentin 300 MG capsule Commonly known as:  NEURONTIN Take 1 capsule (300 mg total) by mouth at bedtime.   omeprazole 20 MG capsule Commonly known as:  PRILOSEC Take 20 mg by mouth daily.   oxymetazoline 0.05 % nasal spray Commonly known as:   AFRIN Place 3 sprays into both nostrils daily as needed for congestion.   potassium chloride 10 MEQ tablet Commonly known as:  K-DUR Take 1 tablet (10 mEq total) by mouth daily as needed (Takes with Lasix).   VELCADE 3.5 MG injection Generic drug:  bortezomib IV Inject 1.3 mg/m2 into the vein once.      Allergies  Allergen Reactions  . Sulfasalazine Other (See Comments)    Years ago, lips and gums broke out in blisters  . Sulfonamide Derivatives Other (See Comments)    Years ago, lips and gums broke out in blisters  . Meloxicam Hives    On 2 separate occasions noted.   Follow-up Information    Midge Minium, MD. Schedule an appointment as soon as possible for a visit in 10 day(s).   Specialty:  Family Medicine Contact information: [address] [place] [address] [phone]            The results of significant diagnostics from this hospitalization (including imaging, microbiology, ancillary and laboratory) are listed below for reference.    Significant Diagnostic Studies: Ct Abdomen Pelvis W Contrast  Result Date: 12/15/2017 CLINICAL DATA:  72 y/o  M; small-bowel obstruction. EXAM: CT ABDOMEN AND PELVIS WITH CONTRAST TECHNIQUE: Multidetector CT imaging of the abdomen and pelvis was performed using the standard protocol following bolus administration of intravenous contrast. CONTRAST:  15mL ISOVUE-300 IOPAMIDOL (ISOVUE-300) INJECTION 61% COMPARISON:  None. FINDINGS: Lower chest: 4 mm right middle lobe nodule (series 2, image 4). Small right pleural effusion. Hepatobiliary: No focal liver abnormality is seen. No gallstones, gallbladder wall thickening, or biliary dilatation. Pancreas: Unremarkable. No pancreatic ductal dilatation or surrounding inflammatory changes. Spleen: Normal in size without focal abnormality. Adrenals/Urinary Tract: Normal adrenal glands. Left kidney interpolar 22 mm cyst. No hydronephrosis. Mild bladder distention. Stomach/Bowel:  Diffuse dilated small bowel to the level of distal ileum. Long segment of small bowel in the mid lower abdomen with wall thickening. Normal appearance of the colon. No evidence for perforation or abscess at this time. Vascular/Lymphatic: Aortic atherosclerosis. No enlarged abdominal or pelvic lymph nodes. Reproductive: Prostate is unremarkable. Other: Small volume of peritoneal ascites. Musculoskeletal: Right total hip prosthesis is partially visualized. No acute osseous abnormality identified. IMPRESSION: 1. Diffuse dilatation of small bowel to the level of distal ileum compatible with small bowel obstruction. No perforation at this time. 2. Long segment of small bowel in the lower mid abdomen with wall thickening may represent infectious or inflammatory enteritis. Small bowel is distended both upstream and downstream. 3. Small right pleural effusion. 4. Small volume of ascites. 5. Aortic atherosclerosis. 6. 4 mm nodule in right middle lobe. No follow-up needed if patient is low-risk. Non-contrast chest CT can be considered in 12 months if patient is high-risk. This recommendation follows the consensus statement: Guidelines for Management of Incidental Pulmonary Nodules Detected on CT Images: From the Fleischner Society 2017; Radiology 2017; 284:228-243. Electronically Signed   By: Kristine Garbe M.D.   On: 12/15/2017  03:29   Dg Abdomen Acute W/chest  Result Date: 12/15/2017 CLINICAL DATA:  72 y/o M; abdominal pain, nausea, vomiting, constipation for 2 days. EXAM: DG ABDOMEN ACUTE W/ 1V CHEST COMPARISON:  None. FINDINGS: Right small pleural effusion. Right basilar opacity. Otherwise clear lungs. Mildly enlarged cardiac silhouette. Small bowel obstruction.  Mild degenerative changes of lumbar spine. IMPRESSION: 1. Small bowel obstruction. 2. Small right pleural effusion and basilar opacity probably representing associated atelectasis. 3. Mild cardiomegaly. Electronically Signed   By: Kristine Garbe M.D.   On: 12/15/2017 01:30    Microbiology: No results found for this or any previous visit (from the past 240 hour(s)).   Labs: Basic Metabolic Panel: Recent Labs  Lab 12/14/17 1250 12/16/17 0352 12/17/17 0356  NA 137 139 137  K 4.1 3.9 4.4  CL 103 107 106  CO2 25 26 23   GLUCOSE 101* 83 84  BUN 20 17 14   CREATININE 1.28* 1.12 1.08  CALCIUM 8.9 8.6* 8.4*   Liver Function Tests: Recent Labs  Lab 12/16/17 0352  AST 24  ALT 30  ALKPHOS 98  BILITOT 1.8*  PROT 5.4*  ALBUMIN 3.1*   Recent Labs  Lab 12/14/17 1250  LIPASE 38   CBC: Recent Labs  Lab 12/14/17 1250 12/16/17 0352 12/17/17 0356  WBC 4.6 3.2* 3.9*  NEUTROABS 3.3  --   --   HGB 14.0 12.5* 12.6*  HCT 41.0 37.0* 37.1*  MCV 91.9 93.0 92.8  PLT 134* 120* 85*   BNP (last 3 results) Recent Labs    08/19/17 0933  BNP 491.5*    ProBNP (last 3 results) Recent Labs    08/07/17 1044  PROBNP 2,046*    Signed:  Barton Dubois MD.  [facility] 12/17/2017, 4:48 PM

## 2017-12-17 NOTE — Final Consult Note (Signed)
Consultant Final Sign-Off Note    Assessment/Final recommendations  Bobby Harper is a 72 y.o. male followed by me for SBO   Wound care (if applicable):    Diet at discharge: soft for one week and small frequent meals, avoid large meals   Activity at discharge: per primary team   Follow-up appointment:  None needed   Pending results:  Unresulted Labs (From admission, onward)   Start     Ordered   12/22/17 0500  Creatinine, serum  (enoxaparin (LOVENOX)    CrCl >/= 30 ml/min)  Weekly,   R    Comments:  while on enoxaparin therapy    12/15/17 2124       Medication recommendations:   Other recommendations:    Thank you for allowing [place] to participate in the care of your patient!  Please consult [place] again if you have further needs for your patient.  Lawsen Arnott L Delray Reza 12/17/2017 11:08 AM    Subjective   CC: SBO  Pt tolerating liquid diet. No nausea or vomiting. No abdominal pain. Pt is having diarrhea.   Objective  Vital signs in last 24 hours: Temp:  [97.6 F (36.4 C)-98.4 F (36.9 C)] 98.2 F (36.8 C) (01/09 0634) Pulse Rate:  [74-86] 76 (01/09 0634) Resp:  [16] 16 (01/09 0634) BP: (110-128)/(64-80) 126/80 (01/09 0634) SpO2:  [91 %-97 %] 96 % (01/09 0634) Weight:  [224 lb (101.6 kg)] 224 lb (101.6 kg) (01/09 8841)  Physical Exam  Constitutional: He is oriented to person, place, and time and well-developed, well-nourished, and in no distress. No distress.  Cardiovascular: Normal rate, regular rhythm and normal heart sounds. No murmur heard. Pulmonary/Chest: rate and effort normal Abdominal: Soft. Normal appearance and bowel sounds are normal. He exhibits no distension. There is no hepatosplenomegaly. There is no tenderness.  Skin: Skin is warm and dry. No rash noted. He is not diaphoretic.  Psychiatric: Mood and affect normal.  Nursing note and vitals reviewed.   Pertinent labs and Studies: Recent Labs    12/14/17 1250 12/16/17 0352 12/17/17 0356  WBC  4.6 3.2* 3.9*  HGB 14.0 12.5* 12.6*  HCT 41.0 37.0* 37.1*   BMET Recent Labs    12/16/17 0352 12/17/17 0356  NA 139 137  K 3.9 4.4  CL 107 106  CO2 26 23  GLUCOSE 83 84  BUN 17 14  CREATININE 1.12 1.08  CALCIUM 8.6* 8.4*   No results for input(s): LABURIN in the last 72 hours. Results for orders placed or performed during the hospital encounter of 06/23/13  Surgical pcr screen     Status: None   Collection Time: 06/23/13  3:15 PM  Result Value Ref Range Status   MRSA, PCR NEGATIVE NEGATIVE Final   Staphylococcus aureus NEGATIVE NEGATIVE Final    Comment:        The Xpert SA Assay (FDA approved for NASAL specimens in patients over 29 years of age), is one component of a comprehensive surveillance program.  Test performance has been validated by [employer] for patients greater than or equal to 83 year old. It is not intended to diagnose infection nor to guide or monitor treatment.    Imaging: No results found.

## 2017-12-18 ENCOUNTER — Encounter: Payer: Self-pay | Admitting: Family Medicine

## 2017-12-18 ENCOUNTER — Telehealth: Payer: Self-pay

## 2017-12-18 NOTE — Telephone Encounter (Signed)
LM requesting call back to complete TCM and schedule hospital f/u with PCP.

## 2017-12-19 ENCOUNTER — Encounter: Payer: Self-pay | Admitting: Physician Assistant

## 2017-12-19 ENCOUNTER — Ambulatory Visit: Payer: Medicare Other | Admitting: Physician Assistant

## 2017-12-19 VITALS — BP 110/60 | HR 77 | Temp 98.3°F | Resp 14 | Ht 73.0 in | Wt 227.0 lb

## 2017-12-19 DIAGNOSIS — I5032 Chronic diastolic (congestive) heart failure: Secondary | ICD-10-CM | POA: Diagnosis not present

## 2017-12-19 DIAGNOSIS — K56609 Unspecified intestinal obstruction, unspecified as to partial versus complete obstruction: Secondary | ICD-10-CM

## 2017-12-19 NOTE — Patient Instructions (Signed)
Please continue soft diet. Increase fluids. Keep as active as possible to promote gastrointestinal motility.  You should continue passing gas normally at this point. Bowel movements should become larger in size and more regular in frequency as you are eating regularly.   Follow-up with specialists as scheduled.  Follow-up with myself or Dr. Birdie Riddle in 1 week for reassessment and to update blood work.  If you note any nausea or vomiting or inability of pass stool or gas associated with pain, please return to the ER.  It was nice talking to you today and I am glad you are feeling better.

## 2017-12-19 NOTE — Progress Notes (Signed)
Patient presents to clinic today for hospital follow-up. Patient presented to the ER on 12/14/17 with c/o abdominal pain, nausea and vomiting. ER workup revealed SBP and therefore patient subsequently admitted to the hospital for further assessment and treatment. NGT placed with good output. NGT removed without any issue or recurrence of symptoms. Condition remained resolved with conservative management. Patient was instructed to keep hydrated and follow a soft food diet. Was discharged on 12/17/17 with instruction to follow-up with PCP in 10 days for re-evaluation and repeat CBC/BMP.   Since discharge, patient states that he has only had two very small bowel movements since discharge 2 days ago. Wanted assessment due to concern for recurrence of SBO. Denies nausea, vomiting, abdominal pain. Is eating small amounts of soft foods and keeping hydrated. Is passing gas without difficulty. Denies change to urinary habits. Denies any new or worsening conditions.   Past Medical History:  Diagnosis Date  . Amyloidosis ([place])   . Arthritis 06-23-13   osteoarthritis right hip/some left shoulder pain  . BPH (benign prostatic hyperplasia)   . Congestive heart failure (CHF) ([place])   . Right hip pain    FOLLOWS DR. FOR ARTHRITIS    Current Outpatient Medications on File Prior to Visit  Medication Sig Dispense Refill  . acyclovir (ZOVIRAX) 200 MG capsule Take 400 mg by mouth 2 (two) times daily.     Marland Kitchen b complex vitamins tablet Take 1 tablet by mouth daily.    . bortezomib IV (VELCADE) 3.5 MG injection Inject 1.3 mg/m2 into the vein once.    . doxycycline (VIBRA-TABS) 100 MG tablet Take 100 mg by mouth 2 (two) times daily.    . furosemide (LASIX) 20 MG tablet Take 1 tablet (20 mg total) by mouth daily as needed for edema.    . gabapentin (NEURONTIN) 100 MG capsule Take 1 capsule (100 mg total) by mouth 3 (three) times daily. 90 capsule 5  . gabapentin (NEURONTIN) 300 MG capsule Take 1 capsule (300 mg total) by  mouth at bedtime. 90 capsule 5  . omeprazole (PRILOSEC) 20 MG capsule Take 20 mg by mouth daily.    Marland Kitchen oxymetazoline (AFRIN) 0.05 % nasal spray Place 3 sprays into both nostrils daily as needed for congestion.    . potassium chloride (K-DUR) 10 MEQ tablet Take 1 tablet (10 mEq total) by mouth daily as needed (Takes with Lasix).     No current facility-administered medications on file prior to visit.     Allergies  Allergen Reactions  . Sulfasalazine Other (See Comments)    Years ago, lips and gums broke out in blisters  . Sulfonamide Derivatives Other (See Comments)    Years ago, lips and gums broke out in blisters  . Meloxicam Hives    On 2 separate occasions noted.    Family History  Problem Relation Age of Onset  . Coronary artery disease Mother   . Hypertension Mother   . Kidney failure Mother   . Heart attack Father   . Heart disease Father 84       MI    Social History   Socioeconomic History  . Marital status: Married    Spouse name: None  . Number of children: 2  . Years of education: 37  . Highest education level: None  Social Needs  . Financial resource strain: None  . Food insecurity - worry: None  . Food insecurity - inability: None  . Transportation needs - medical: None  . Transportation needs -  non-medical: None  Occupational History  . None  Tobacco Use  . Smoking status: Never Smoker  . Smokeless tobacco: Never Used  Substance and Sexual Activity  . Alcohol use: Yes    Comment: weekends, socially  . Drug use: No  . Sexual activity: Yes  Other Topics Concern  . None  Social History Narrative   Lives with wife in a 2 story home.  Has 2 children.     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     Education: college.    Review of Systems - See HPI.  All other ROS are negative.  BP 110/60   Pulse 77   Temp 98.3 F (36.8 C) (Oral)   Resp 14   Ht 6' 1" (1.854 m)   Wt 227 lb (103 kg)   SpO2 95%   BMI 29.95 kg/m   Physical Exam    Constitutional: He is oriented to person, place, and time and well-developed, well-nourished, and in no distress.  HENT:  Head: Normocephalic and atraumatic.  Eyes: Conjunctivae are normal.  Neck: Neck supple.  Cardiovascular: Normal rate, regular rhythm, normal heart sounds and intact distal pulses.  Pulmonary/Chest: Effort normal and breath sounds normal. No respiratory distress. He has no wheezes. He has no rales. He exhibits no tenderness.  Abdominal: Soft. Bowel sounds are normal. He exhibits no distension and no mass. There is no tenderness. There is no rebound and no guarding.  Lymphadenopathy:    He has no cervical adenopathy.  Neurological: He is alert and oriented to person, place, and time. No cranial nerve deficit.  Skin: Skin is warm and dry. No rash noted.  Psychiatric: Affect normal.  Vitals reviewed.  Recent Results (from the past 2160 hour(s))  CBC with Differential     Status: Abnormal   Collection Time: 12/14/17 12:50 PM  Result Value Ref Range   WBC 4.6 4.0 - 10.5 K/uL   RBC 4.46 4.22 - 5.81 MIL/uL   Hemoglobin 14.0 13.0 - 17.0 g/dL   HCT 41.0 39.0 - 52.0 %   MCV 91.9 78.0 - 100.0 fL   MCH 31.4 26.0 - 34.0 pg   MCHC 34.1 30.0 - 36.0 g/dL   RDW 14.4 11.5 - 15.5 %   Platelets 134 (L) 150 - 400 K/uL   Neutrophils Relative % 71 %   Neutro Abs 3.3 1.7 - 7.7 K/uL   Lymphocytes Relative 18 %   Lymphs Abs 0.8 0.7 - 4.0 K/uL   Monocytes Relative 11 %   Monocytes Absolute 0.5 0.1 - 1.0 K/uL   Eosinophils Relative 0 %   Eosinophils Absolute 0.0 0.0 - 0.7 K/uL   Basophils Relative 0 %   Basophils Absolute 0.0 0.0 - 0.1 K/uL  Basic metabolic panel     Status: Abnormal   Collection Time: 12/14/17 12:50 PM  Result Value Ref Range   Sodium 137 135 - 145 mmol/L   Potassium 4.1 3.5 - 5.1 mmol/L   Chloride 103 101 - 111 mmol/L   CO2 25 22 - 32 mmol/L   Glucose, Bld 101 (H) 65 - 99 mg/dL   BUN 20 6 - 20 mg/dL   Creatinine, Ser 1.28 (H) 0.61 - 1.24 mg/dL   Calcium 8.9  8.9 - 10.3 mg/dL   GFR calc non Af Amer 55 (L) >60 mL/min   GFR calc Af Amer >60 >60 mL/min    Comment: (NOTE) The eGFR has been calculated using the CKD EPI equation. This calculation has not   been validated in all clinical situations. eGFR's persistently <60 mL/min signify possible Chronic Kidney Disease.    Anion gap 9 5 - 15  Lipase, blood     Status: None   Collection Time: 12/14/17 12:50 PM  Result Value Ref Range   Lipase 38 11 - 51 U/L  Urinalysis, Routine w reflex microscopic     Status: Abnormal   Collection Time: 12/15/17  2:45 PM  Result Value Ref Range   Color, Urine YELLOW YELLOW   APPearance CLEAR CLEAR   Specific Gravity, Urine >1.030 (H) 1.005 - 1.030   pH 5.5 5.0 - 8.0   Glucose, UA NEGATIVE NEGATIVE mg/dL   Hgb urine dipstick NEGATIVE NEGATIVE   Bilirubin Urine NEGATIVE NEGATIVE   Ketones, ur NEGATIVE NEGATIVE mg/dL   Protein, ur 100 (A) NEGATIVE mg/dL   Nitrite NEGATIVE NEGATIVE   Leukocytes, UA NEGATIVE NEGATIVE  Urinalysis, Microscopic (reflex)     Status: Abnormal   Collection Time: 12/15/17  2:45 PM  Result Value Ref Range   RBC / HPF NONE SEEN 0 - 5 RBC/hpf   WBC, UA NONE SEEN 0 - 5 WBC/hpf   Bacteria, UA RARE (A) NONE SEEN   Squamous Epithelial / LPF 0-5 (A) NONE SEEN  Comprehensive metabolic panel     Status: Abnormal   Collection Time: 12/16/17  3:52 AM  Result Value Ref Range   Sodium 139 135 - 145 mmol/L   Potassium 3.9 3.5 - 5.1 mmol/L   Chloride 107 101 - 111 mmol/L   CO2 26 22 - 32 mmol/L   Glucose, Bld 83 65 - 99 mg/dL   BUN 17 6 - 20 mg/dL   Creatinine, Ser 1.12 0.61 - 1.24 mg/dL   Calcium 8.6 (L) 8.9 - 10.3 mg/dL   Total Protein 5.4 (L) 6.5 - 8.1 g/dL   Albumin 3.1 (L) 3.5 - 5.0 g/dL   AST 24 15 - 41 U/L   ALT 30 17 - 63 U/L   Alkaline Phosphatase 98 38 - 126 U/L   Total Bilirubin 1.8 (H) 0.3 - 1.2 mg/dL   GFR calc non Af Amer >60 >60 mL/min   GFR calc Af Amer >60 >60 mL/min    Comment: (NOTE) The eGFR has been calculated using  the CKD EPI equation. This calculation has not been validated in all clinical situations. eGFR's persistently <60 mL/min signify possible Chronic Kidney Disease.    Anion gap 6 5 - 15  CBC     Status: Abnormal   Collection Time: 12/16/17  3:52 AM  Result Value Ref Range   WBC 3.2 (L) 4.0 - 10.5 K/uL   RBC 3.98 (L) 4.22 - 5.81 MIL/uL   Hemoglobin 12.5 (L) 13.0 - 17.0 g/dL   HCT 37.0 (L) 39.0 - 52.0 %   MCV 93.0 78.0 - 100.0 fL   MCH 31.4 26.0 - 34.0 pg   MCHC 33.8 30.0 - 36.0 g/dL   RDW 14.4 11.5 - 15.5 %   Platelets 120 (L) 150 - 400 K/uL  CBC     Status: Abnormal   Collection Time: 12/17/17  3:56 AM  Result Value Ref Range   WBC 3.9 (L) 4.0 - 10.5 K/uL   RBC 4.00 (L) 4.22 - 5.81 MIL/uL   Hemoglobin 12.6 (L) 13.0 - 17.0 g/dL   HCT 37.1 (L) 39.0 - 52.0 %   MCV 92.8 78.0 - 100.0 fL   MCH 31.5 26.0 - 34.0 pg   MCHC 34.0 30.0 - 36.0 g/dL     RDW 14.1 11.5 - 15.5 %   Platelets 85 (L) 150 - 400 K/uL    Comment: REPEATED TO VERIFY SPECIMEN CHECKED FOR CLOTS PLATELET COUNT CONFIRMED BY SMEAR   Basic metabolic panel     Status: Abnormal   Collection Time: 12/17/17  3:56 AM  Result Value Ref Range   Sodium 137 135 - 145 mmol/L   Potassium 4.4 3.5 - 5.1 mmol/L   Chloride 106 101 - 111 mmol/L   CO2 23 22 - 32 mmol/L   Glucose, Bld 84 65 - 99 mg/dL   BUN 14 6 - 20 mg/dL   Creatinine, Ser 1.08 0.61 - 1.24 mg/dL   Calcium 8.4 (L) 8.9 - 10.3 mg/dL   GFR calc non Af Amer >60 >60 mL/min   GFR calc Af Amer >60 >60 mL/min    Comment: (NOTE) The eGFR has been calculated using the CKD EPI equation. This calculation has not been validated in all clinical situations. eGFR's persistently <60 mL/min signify possible Chronic Kidney Disease.    Anion gap 8 5 - 15   Assessment/Plan: Chronic diastolic CHF (congestive heart failure) (HCC) Euvolemic on examination today. Continue care as directed by Cardiology. Too soon to repeat labs today as drawn 2 days ago. Will repeat labs at follow-up in  1 week.  SBO (small bowel obstruction) (HCC) Clinically resolved. Discussed with patient that it will take a while for bowel movements to be the same amount/consistency as before the SBO, especially since he is only eating very small amounts of very soft foods. Is having output which he is instructed to monitor. No pain, nausea or vomiting. Reviewed alarm signs or symptoms concerning for recurrence which would prompt ER assessment. He seems reassured after assessment. Follow-up 1 week.     William Cody Martin, PA-C 

## 2017-12-25 DIAGNOSIS — E8581 Light chain (AL) amyloidosis: Secondary | ICD-10-CM | POA: Diagnosis not present

## 2017-12-25 NOTE — Assessment & Plan Note (Signed)
Euvolemic on examination today. Continue care as directed by Cardiology. Too soon to repeat labs today as drawn 2 days ago. Will repeat labs at follow-up in 1 week.

## 2017-12-25 NOTE — Assessment & Plan Note (Signed)
Clinically resolved. Discussed with patient that it will take a while for bowel movements to be the same amount/consistency as before the SBO, especially since he is only eating very small amounts of very soft foods. Is having output which he is instructed to monitor. No pain, nausea or vomiting. Reviewed alarm signs or symptoms concerning for recurrence which would prompt ER assessment. He seems reassured after assessment. Follow-up 1 week.

## 2017-12-30 ENCOUNTER — Encounter: Payer: Self-pay | Admitting: Family Medicine

## 2017-12-30 DIAGNOSIS — E854 Organ-limited amyloidosis: Secondary | ICD-10-CM | POA: Diagnosis not present

## 2017-12-30 DIAGNOSIS — Z5181 Encounter for therapeutic drug level monitoring: Secondary | ICD-10-CM | POA: Diagnosis not present

## 2017-12-30 DIAGNOSIS — E8581 Light chain (AL) amyloidosis: Secondary | ICD-10-CM | POA: Diagnosis not present

## 2017-12-30 DIAGNOSIS — I43 Cardiomyopathy in diseases classified elsewhere: Secondary | ICD-10-CM | POA: Diagnosis not present

## 2018-01-08 DIAGNOSIS — E8581 Light chain (AL) amyloidosis: Secondary | ICD-10-CM | POA: Diagnosis not present

## 2018-01-15 DIAGNOSIS — E8581 Light chain (AL) amyloidosis: Secondary | ICD-10-CM | POA: Diagnosis not present

## 2018-01-29 DIAGNOSIS — E8581 Light chain (AL) amyloidosis: Secondary | ICD-10-CM | POA: Diagnosis not present

## 2018-02-05 DIAGNOSIS — E8581 Light chain (AL) amyloidosis: Secondary | ICD-10-CM | POA: Diagnosis not present

## 2018-02-19 DIAGNOSIS — E876 Hypokalemia: Secondary | ICD-10-CM | POA: Diagnosis not present

## 2018-02-19 DIAGNOSIS — K521 Toxic gastroenteritis and colitis: Secondary | ICD-10-CM | POA: Diagnosis not present

## 2018-02-19 DIAGNOSIS — E8581 Light chain (AL) amyloidosis: Secondary | ICD-10-CM | POA: Diagnosis not present

## 2018-02-19 DIAGNOSIS — R600 Localized edema: Secondary | ICD-10-CM | POA: Diagnosis not present

## 2018-03-03 DIAGNOSIS — I5033 Acute on chronic diastolic (congestive) heart failure: Secondary | ICD-10-CM | POA: Diagnosis not present

## 2018-03-05 DIAGNOSIS — E8581 Light chain (AL) amyloidosis: Secondary | ICD-10-CM | POA: Diagnosis not present

## 2018-03-12 DIAGNOSIS — E8581 Light chain (AL) amyloidosis: Secondary | ICD-10-CM | POA: Diagnosis not present

## 2018-03-29 IMAGING — CT CT ABD-PELV W/ CM
2 of 5 series · 16 of 46 positions shown, 18 images · IV contrast (APPLIED)
Comparison: None.

CLINICAL DATA: 71 y/o  M; small-bowel obstruction.

EXAM:
CT ABDOMEN AND PELVIS WITH CONTRAST
TECHNIQUE: Multidetector CT imaging of the abdomen and pelvis was performed
using the standard protocol following bolus administration of
intravenous contrast.
CONTRAST:  100mL [id number] IOPAMIDOL ([id number]) INJECTION 61%

[Series 2: axial st · axial · 0.87mm/px · z∈[+564,+999]mm · 13 of 99 slices shown, 15 images]
[im 6/99  soft-tissue]
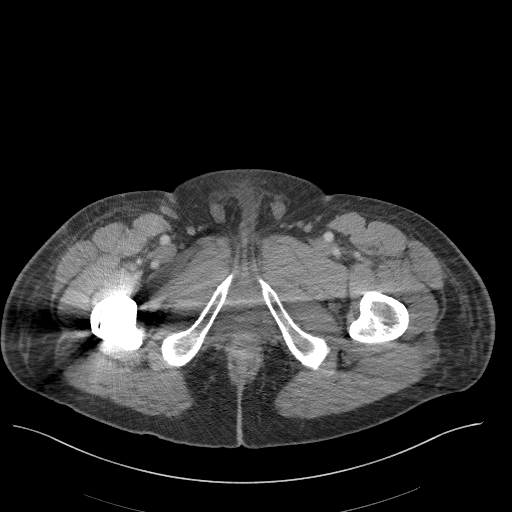
[im 6/99  bone]
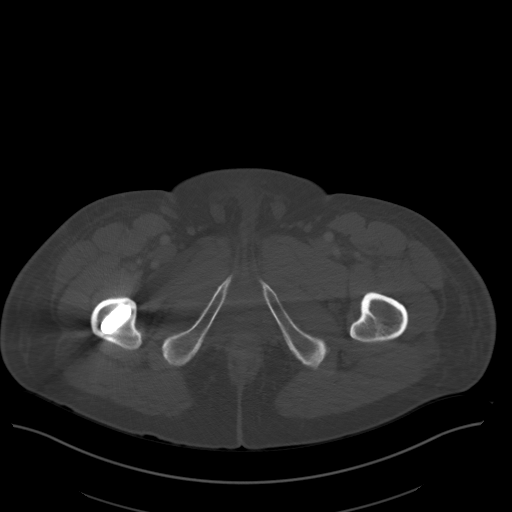
[im 12/99  soft-tissue]
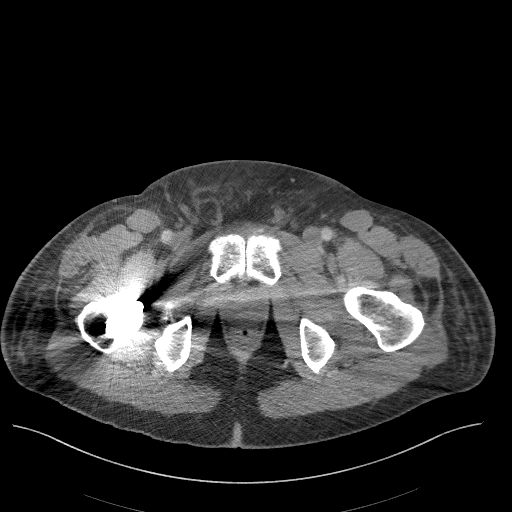
[im 24/99  soft-tissue]
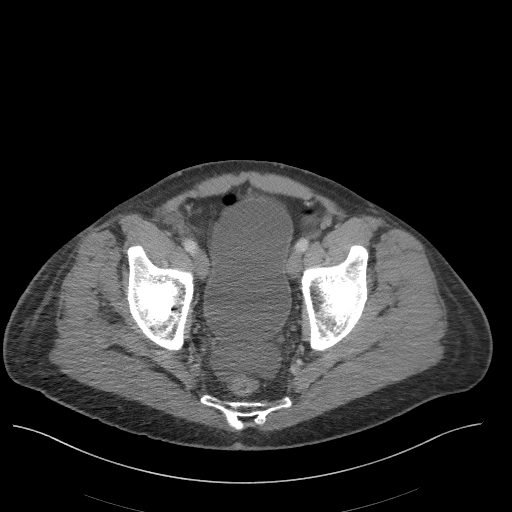
[im 29/99  soft-tissue]
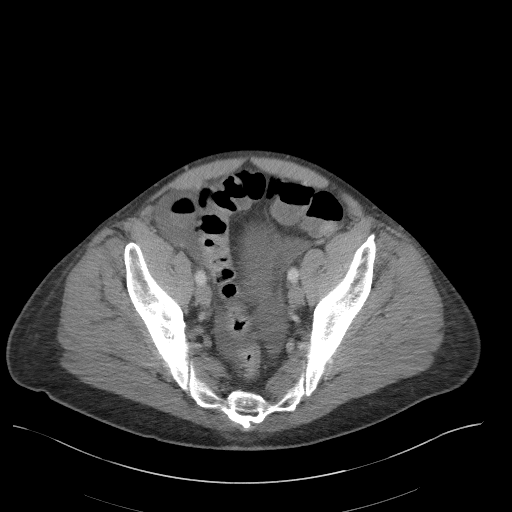
[im 35/99  soft-tissue]
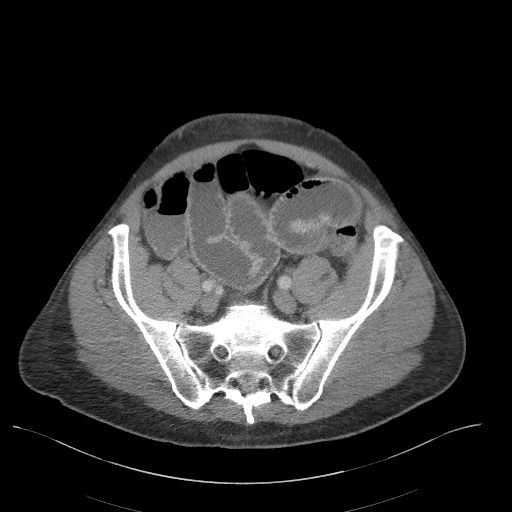
[im 41/99  soft-tissue]
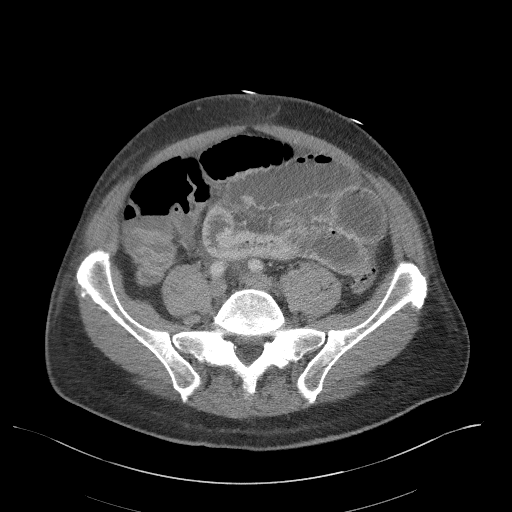
[im 52/99  soft-tissue]
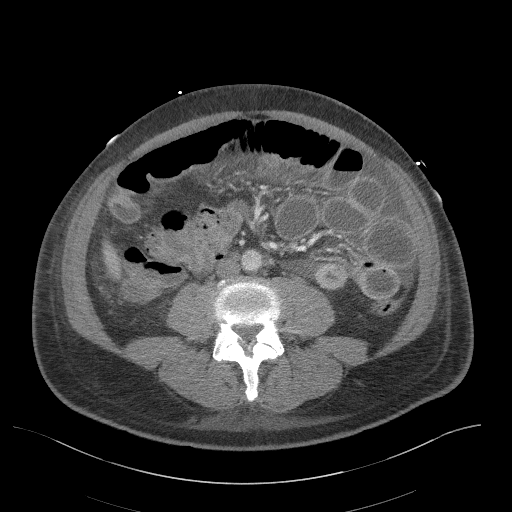
[im 58/99  soft-tissue]
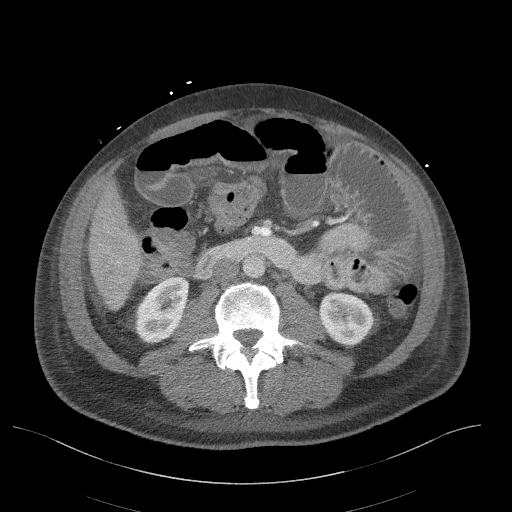
[im 64/99  soft-tissue]
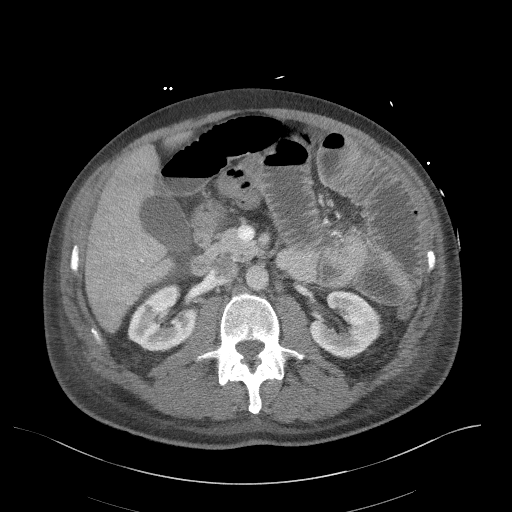
[im 64/99  bone]
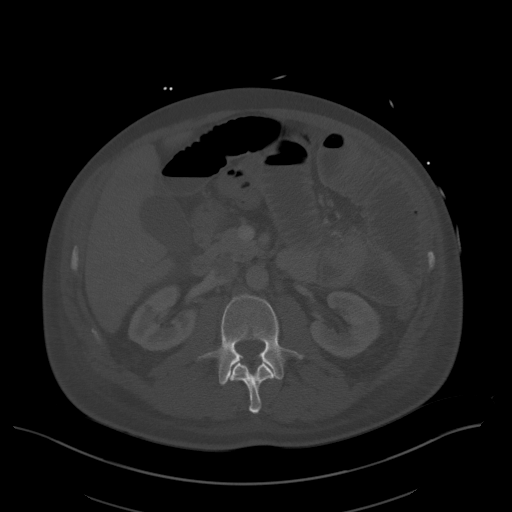
[im 70/99  soft-tissue]
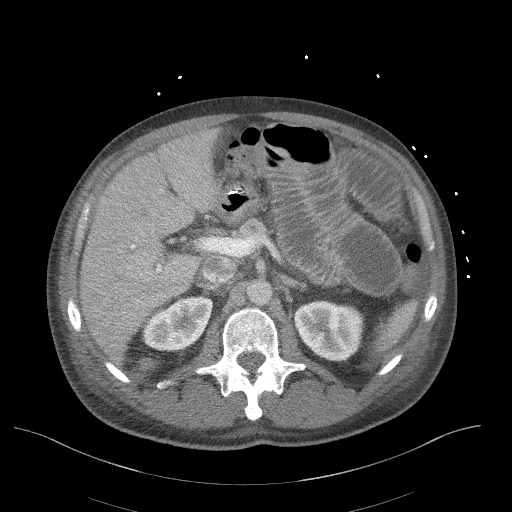
[im 75/99  soft-tissue]
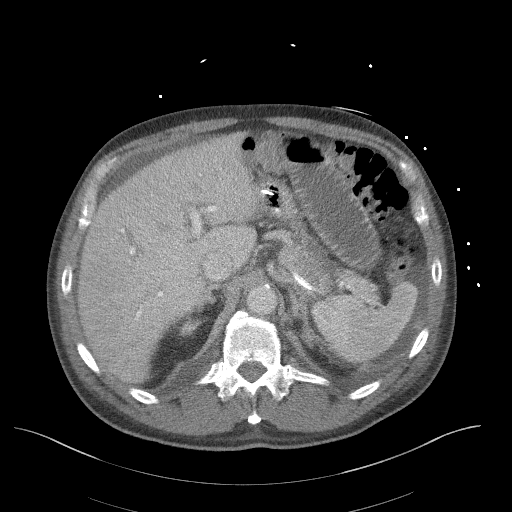
[im 87/99  soft-tissue]
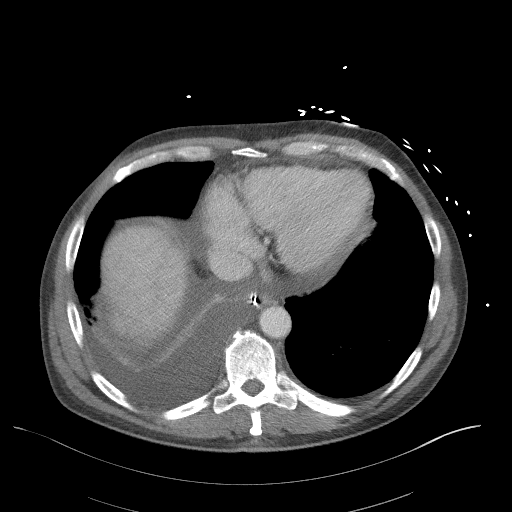
[im 93/99  soft-tissue]
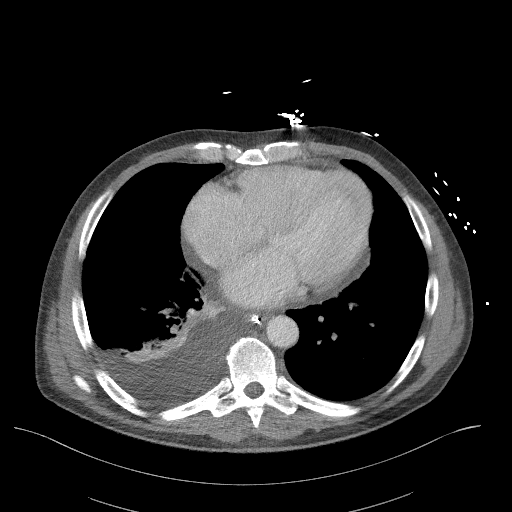

[Series 4: coronal st · coronal · 0.78mm/px · 3 of 108 slices shown]
[im 36/108  soft-tissue]
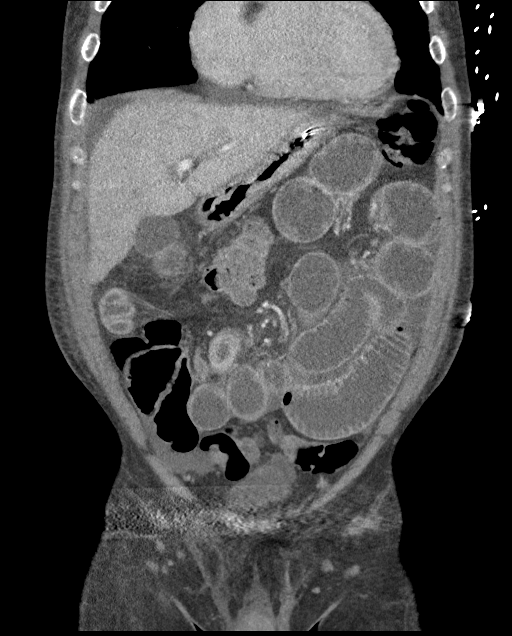
[im 48/108  soft-tissue]
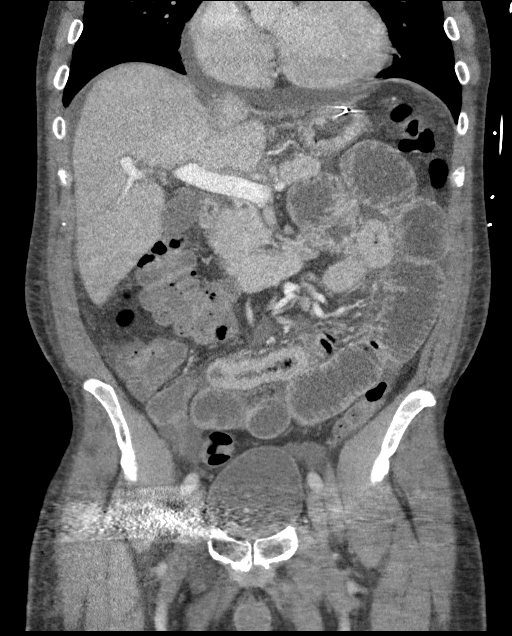
[im 60/108  soft-tissue]
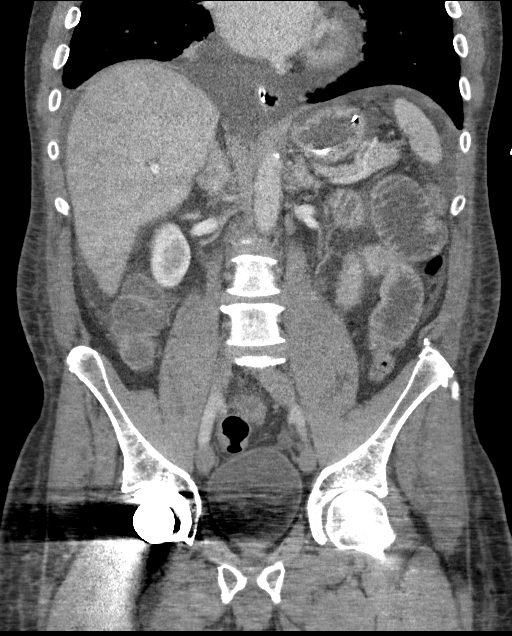

[16 of 46 positions shown; findings below may reference images not displayed]

FINDINGS: Lower chest: 4 mm right middle lobe nodule (series 2, image 4).
Small right pleural effusion.

Hepatobiliary: No focal liver abnormality is seen. No gallstones,
gallbladder wall thickening, or biliary dilatation.

Pancreas: Unremarkable. No pancreatic ductal dilatation or
surrounding inflammatory changes.

Spleen: Normal in size without focal abnormality.

Adrenals/Urinary Tract: Normal adrenal glands. Left kidney
interpolar 22 mm cyst. No hydronephrosis. Mild bladder distention.

Stomach/Bowel: Diffuse dilated small bowel to the level of distal
ileum. Long segment of small bowel in the mid lower abdomen with
wall thickening. Normal appearance of the colon. No evidence for
perforation or abscess at this time.

Vascular/Lymphatic: Aortic atherosclerosis. No enlarged abdominal or
pelvic lymph nodes.

Reproductive: Prostate is unremarkable.

Other: Small volume of peritoneal ascites.

Musculoskeletal: Right total hip prosthesis is partially visualized.
No acute osseous abnormality identified.
IMPRESSION: 1. Diffuse dilatation of small bowel to the level of distal ileum
compatible with small bowel obstruction. No perforation at this
time.
2. Long segment of small bowel in the lower mid abdomen with wall
thickening may represent infectious or inflammatory enteritis. Small
bowel is distended both upstream and downstream.
3. Small right pleural effusion.
4. Small volume of ascites.
5. Aortic atherosclerosis.
6. 4 mm nodule in right middle lobe. No follow-up needed if patient
is low-risk. Non-contrast chest CT can be considered in 12 months if
patient is high-risk. This recommendation follows the consensus
statement: Guidelines for Management of Incidental Pulmonary Nodules
Detected on CT Images: From the [HOSPITAL] 8181; Radiology

By: Sorin Oxendine M.D.

## 2018-04-02 DIAGNOSIS — E8581 Light chain (AL) amyloidosis: Secondary | ICD-10-CM | POA: Diagnosis not present

## 2018-04-23 DIAGNOSIS — E8581 Light chain (AL) amyloidosis: Secondary | ICD-10-CM | POA: Diagnosis not present

## 2018-05-19 DIAGNOSIS — E8581 Light chain (AL) amyloidosis: Secondary | ICD-10-CM | POA: Diagnosis not present

## 2018-05-19 DIAGNOSIS — I440 Atrioventricular block, first degree: Secondary | ICD-10-CM | POA: Diagnosis not present

## 2018-05-19 DIAGNOSIS — D704 Cyclic neutropenia: Secondary | ICD-10-CM | POA: Diagnosis not present

## 2018-05-19 DIAGNOSIS — I313 Pericardial effusion (noninflammatory): Secondary | ICD-10-CM | POA: Diagnosis not present

## 2018-05-19 DIAGNOSIS — I4581 Long QT syndrome: Secondary | ICD-10-CM | POA: Diagnosis not present

## 2018-05-19 DIAGNOSIS — I081 Rheumatic disorders of both mitral and tricuspid valves: Secondary | ICD-10-CM | POA: Diagnosis not present

## 2018-05-19 DIAGNOSIS — E8809 Other disorders of plasma-protein metabolism, not elsewhere classified: Secondary | ICD-10-CM | POA: Diagnosis not present

## 2018-06-02 DIAGNOSIS — E854 Organ-limited amyloidosis: Secondary | ICD-10-CM | POA: Diagnosis not present

## 2018-06-02 DIAGNOSIS — E8581 Light chain (AL) amyloidosis: Secondary | ICD-10-CM | POA: Diagnosis not present

## 2018-06-02 DIAGNOSIS — G63 Polyneuropathy in diseases classified elsewhere: Secondary | ICD-10-CM | POA: Diagnosis not present

## 2018-06-02 DIAGNOSIS — E851 Neuropathic heredofamilial amyloidosis: Secondary | ICD-10-CM | POA: Diagnosis not present

## 2018-06-02 DIAGNOSIS — E8809 Other disorders of plasma-protein metabolism, not elsewhere classified: Secondary | ICD-10-CM | POA: Diagnosis not present

## 2018-06-19 ENCOUNTER — Telehealth: Payer: Self-pay | Admitting: *Deleted

## 2018-06-19 NOTE — Telephone Encounter (Signed)
Pt informed of PCP recommendation. He will give them a call.

## 2018-06-19 NOTE — Telephone Encounter (Signed)
I think [facility] is fantastic and they have multiple locations.  That is who I would recommend

## 2018-06-19 NOTE — Telephone Encounter (Signed)
Patient called the [facility] and stated that he has a written order for PT/OT after Chemo.   This was given to him from [place].   Patient wants a call from Dr. Virgil Benedict CMA to let him know where the best place to go to take that RX.  He was very adamant  With the [facility] that he speak to someone soon so that he knows what to do.    Patient aware that message is being sent and that he will receive a call back.

## 2018-06-19 NOTE — Telephone Encounter (Signed)
Please advise 

## 2018-06-23 DIAGNOSIS — R52 Pain, unspecified: Secondary | ICD-10-CM | POA: Diagnosis not present

## 2018-06-23 DIAGNOSIS — R200 Anesthesia of skin: Secondary | ICD-10-CM | POA: Diagnosis not present

## 2018-06-23 DIAGNOSIS — E8581 Light chain (AL) amyloidosis: Secondary | ICD-10-CM | POA: Diagnosis not present

## 2018-06-23 DIAGNOSIS — G6289 Other specified polyneuropathies: Secondary | ICD-10-CM | POA: Diagnosis not present

## 2018-06-25 DIAGNOSIS — R262 Difficulty in walking, not elsewhere classified: Secondary | ICD-10-CM | POA: Diagnosis not present

## 2018-06-25 DIAGNOSIS — G9009 Other idiopathic peripheral autonomic neuropathy: Secondary | ICD-10-CM | POA: Diagnosis not present

## 2018-06-25 DIAGNOSIS — Z9221 Personal history of antineoplastic chemotherapy: Secondary | ICD-10-CM | POA: Diagnosis not present

## 2018-06-25 DIAGNOSIS — M6281 Muscle weakness (generalized): Secondary | ICD-10-CM | POA: Diagnosis not present

## 2018-06-30 DIAGNOSIS — G9009 Other idiopathic peripheral autonomic neuropathy: Secondary | ICD-10-CM | POA: Diagnosis not present

## 2018-06-30 DIAGNOSIS — R262 Difficulty in walking, not elsewhere classified: Secondary | ICD-10-CM | POA: Diagnosis not present

## 2018-06-30 DIAGNOSIS — Z9221 Personal history of antineoplastic chemotherapy: Secondary | ICD-10-CM | POA: Diagnosis not present

## 2018-06-30 DIAGNOSIS — M6281 Muscle weakness (generalized): Secondary | ICD-10-CM | POA: Diagnosis not present

## 2018-07-01 ENCOUNTER — Ambulatory Visit: Payer: Medicare Other | Admitting: Physical Therapy

## 2018-07-01 DIAGNOSIS — E8581 Light chain (AL) amyloidosis: Secondary | ICD-10-CM | POA: Diagnosis not present

## 2018-07-01 DIAGNOSIS — G6289 Other specified polyneuropathies: Secondary | ICD-10-CM | POA: Diagnosis not present

## 2018-07-01 DIAGNOSIS — E851 Neuropathic heredofamilial amyloidosis: Secondary | ICD-10-CM | POA: Diagnosis not present

## 2018-07-01 DIAGNOSIS — R52 Pain, unspecified: Secondary | ICD-10-CM | POA: Diagnosis not present

## 2018-07-02 DIAGNOSIS — G9009 Other idiopathic peripheral autonomic neuropathy: Secondary | ICD-10-CM | POA: Diagnosis not present

## 2018-07-02 DIAGNOSIS — Z9221 Personal history of antineoplastic chemotherapy: Secondary | ICD-10-CM | POA: Diagnosis not present

## 2018-07-02 DIAGNOSIS — M6281 Muscle weakness (generalized): Secondary | ICD-10-CM | POA: Diagnosis not present

## 2018-07-02 DIAGNOSIS — R262 Difficulty in walking, not elsewhere classified: Secondary | ICD-10-CM | POA: Diagnosis not present

## 2018-07-10 DIAGNOSIS — M6281 Muscle weakness (generalized): Secondary | ICD-10-CM | POA: Diagnosis not present

## 2018-07-10 DIAGNOSIS — R262 Difficulty in walking, not elsewhere classified: Secondary | ICD-10-CM | POA: Diagnosis not present

## 2018-07-10 DIAGNOSIS — Z9221 Personal history of antineoplastic chemotherapy: Secondary | ICD-10-CM | POA: Diagnosis not present

## 2018-07-10 DIAGNOSIS — G9009 Other idiopathic peripheral autonomic neuropathy: Secondary | ICD-10-CM | POA: Diagnosis not present

## 2018-07-14 DIAGNOSIS — G9009 Other idiopathic peripheral autonomic neuropathy: Secondary | ICD-10-CM | POA: Diagnosis not present

## 2018-07-14 DIAGNOSIS — R262 Difficulty in walking, not elsewhere classified: Secondary | ICD-10-CM | POA: Diagnosis not present

## 2018-07-14 DIAGNOSIS — M6281 Muscle weakness (generalized): Secondary | ICD-10-CM | POA: Diagnosis not present

## 2018-07-14 DIAGNOSIS — Z9221 Personal history of antineoplastic chemotherapy: Secondary | ICD-10-CM | POA: Diagnosis not present

## 2018-07-16 DIAGNOSIS — R262 Difficulty in walking, not elsewhere classified: Secondary | ICD-10-CM | POA: Diagnosis not present

## 2018-07-16 DIAGNOSIS — M6281 Muscle weakness (generalized): Secondary | ICD-10-CM | POA: Diagnosis not present

## 2018-07-16 DIAGNOSIS — G9009 Other idiopathic peripheral autonomic neuropathy: Secondary | ICD-10-CM | POA: Diagnosis not present

## 2018-07-16 DIAGNOSIS — Z9221 Personal history of antineoplastic chemotherapy: Secondary | ICD-10-CM | POA: Diagnosis not present

## 2018-07-21 DIAGNOSIS — G9009 Other idiopathic peripheral autonomic neuropathy: Secondary | ICD-10-CM | POA: Diagnosis not present

## 2018-07-21 DIAGNOSIS — R262 Difficulty in walking, not elsewhere classified: Secondary | ICD-10-CM | POA: Diagnosis not present

## 2018-07-21 DIAGNOSIS — Z9221 Personal history of antineoplastic chemotherapy: Secondary | ICD-10-CM | POA: Diagnosis not present

## 2018-07-21 DIAGNOSIS — M6281 Muscle weakness (generalized): Secondary | ICD-10-CM | POA: Diagnosis not present

## 2018-07-23 DIAGNOSIS — M6281 Muscle weakness (generalized): Secondary | ICD-10-CM | POA: Diagnosis not present

## 2018-07-23 DIAGNOSIS — E8581 Light chain (AL) amyloidosis: Secondary | ICD-10-CM | POA: Diagnosis not present

## 2018-07-23 DIAGNOSIS — E851 Neuropathic heredofamilial amyloidosis: Secondary | ICD-10-CM | POA: Diagnosis not present

## 2018-07-23 DIAGNOSIS — G9009 Other idiopathic peripheral autonomic neuropathy: Secondary | ICD-10-CM | POA: Diagnosis not present

## 2018-07-23 DIAGNOSIS — G63 Polyneuropathy in diseases classified elsewhere: Secondary | ICD-10-CM | POA: Diagnosis not present

## 2018-07-23 DIAGNOSIS — Z9221 Personal history of antineoplastic chemotherapy: Secondary | ICD-10-CM | POA: Diagnosis not present

## 2018-07-23 DIAGNOSIS — R262 Difficulty in walking, not elsewhere classified: Secondary | ICD-10-CM | POA: Diagnosis not present

## 2018-07-28 DIAGNOSIS — Z9221 Personal history of antineoplastic chemotherapy: Secondary | ICD-10-CM | POA: Diagnosis not present

## 2018-07-28 DIAGNOSIS — R262 Difficulty in walking, not elsewhere classified: Secondary | ICD-10-CM | POA: Diagnosis not present

## 2018-07-28 DIAGNOSIS — G9009 Other idiopathic peripheral autonomic neuropathy: Secondary | ICD-10-CM | POA: Diagnosis not present

## 2018-07-28 DIAGNOSIS — M6281 Muscle weakness (generalized): Secondary | ICD-10-CM | POA: Diagnosis not present

## 2018-07-29 ENCOUNTER — Encounter: Payer: Self-pay | Admitting: Family Medicine

## 2018-07-30 DIAGNOSIS — Z9221 Personal history of antineoplastic chemotherapy: Secondary | ICD-10-CM | POA: Diagnosis not present

## 2018-07-30 DIAGNOSIS — M6281 Muscle weakness (generalized): Secondary | ICD-10-CM | POA: Diagnosis not present

## 2018-07-30 DIAGNOSIS — G9009 Other idiopathic peripheral autonomic neuropathy: Secondary | ICD-10-CM | POA: Diagnosis not present

## 2018-07-30 DIAGNOSIS — R262 Difficulty in walking, not elsewhere classified: Secondary | ICD-10-CM | POA: Diagnosis not present

## 2018-08-04 DIAGNOSIS — G9009 Other idiopathic peripheral autonomic neuropathy: Secondary | ICD-10-CM | POA: Diagnosis not present

## 2018-08-04 DIAGNOSIS — R262 Difficulty in walking, not elsewhere classified: Secondary | ICD-10-CM | POA: Diagnosis not present

## 2018-08-04 DIAGNOSIS — Z9221 Personal history of antineoplastic chemotherapy: Secondary | ICD-10-CM | POA: Diagnosis not present

## 2018-08-04 DIAGNOSIS — M6281 Muscle weakness (generalized): Secondary | ICD-10-CM | POA: Diagnosis not present

## 2018-08-06 DIAGNOSIS — M6281 Muscle weakness (generalized): Secondary | ICD-10-CM | POA: Diagnosis not present

## 2018-08-06 DIAGNOSIS — Z9221 Personal history of antineoplastic chemotherapy: Secondary | ICD-10-CM | POA: Diagnosis not present

## 2018-08-06 DIAGNOSIS — R262 Difficulty in walking, not elsewhere classified: Secondary | ICD-10-CM | POA: Diagnosis not present

## 2018-08-06 DIAGNOSIS — G9009 Other idiopathic peripheral autonomic neuropathy: Secondary | ICD-10-CM | POA: Diagnosis not present

## 2018-08-11 ENCOUNTER — Other Ambulatory Visit: Payer: Self-pay | Admitting: Neurology

## 2018-08-13 DIAGNOSIS — M6281 Muscle weakness (generalized): Secondary | ICD-10-CM | POA: Diagnosis not present

## 2018-08-13 DIAGNOSIS — G9009 Other idiopathic peripheral autonomic neuropathy: Secondary | ICD-10-CM | POA: Diagnosis not present

## 2018-08-13 DIAGNOSIS — R262 Difficulty in walking, not elsewhere classified: Secondary | ICD-10-CM | POA: Diagnosis not present

## 2018-08-13 DIAGNOSIS — Z9221 Personal history of antineoplastic chemotherapy: Secondary | ICD-10-CM | POA: Diagnosis not present

## 2018-08-25 DIAGNOSIS — E8581 Light chain (AL) amyloidosis: Secondary | ICD-10-CM | POA: Diagnosis not present

## 2018-09-03 DIAGNOSIS — I43 Cardiomyopathy in diseases classified elsewhere: Secondary | ICD-10-CM | POA: Diagnosis not present

## 2018-09-03 DIAGNOSIS — N08 Glomerular disorders in diseases classified elsewhere: Secondary | ICD-10-CM | POA: Diagnosis not present

## 2018-09-03 DIAGNOSIS — E854 Organ-limited amyloidosis: Secondary | ICD-10-CM | POA: Diagnosis not present

## 2018-09-03 DIAGNOSIS — E8581 Light chain (AL) amyloidosis: Secondary | ICD-10-CM | POA: Diagnosis not present

## 2018-09-13 ENCOUNTER — Other Ambulatory Visit: Payer: Self-pay | Admitting: Neurology

## 2018-09-30 ENCOUNTER — Ambulatory Visit: Payer: Medicare Other | Admitting: Family Medicine

## 2018-09-30 ENCOUNTER — Ambulatory Visit: Payer: Medicare Other

## 2018-10-06 DIAGNOSIS — E854 Organ-limited amyloidosis: Secondary | ICD-10-CM | POA: Diagnosis not present

## 2018-10-06 DIAGNOSIS — I43 Cardiomyopathy in diseases classified elsewhere: Secondary | ICD-10-CM | POA: Diagnosis not present

## 2018-10-14 NOTE — Progress Notes (Addendum)
Subjective:   Bobby Harper is a 72 y.o. male who presents for Medicare Annual/Subsequent preventive examination.  Review of Systems:  No ROS.  Medicare Wellness Visit. Additional risk factors are reflected in the social history.  Cardiac Risk Factors include: advanced age (>72men, >28 women);family history of premature cardiovascular disease;male gender   Sleep patterns: Sleeps 7-8 hours.  Home Safety/Smoke Alarms: Feels safe in home. Smoke alarms in place.  Living environment; residence and Firearm Safety: Lives with wife and adult child (CP) in 2 story home.   Seat Belt Safety/Bike Helmet: Wears seat belt.   Male:   CCS-Colonoscopy 04/18/2016, normal. [facility].      PSA-  Lab Results  Component Value Date   PSA 0.14 09/24/2017   PSA 0.19 09/19/2016   PSA 0.20 09/13/2015       Objective:    Vitals: BP 128/60 (BP Location: Left Arm, Patient Position: Sitting, Cuff Size: Normal)   Pulse 72   Temp 98 F (36.7 C) (Temporal)   Resp 16   Ht 6\' 1"  (1.854 m)   Wt 202 lb 4 oz (91.7 kg)   SpO2 98%   BMI 26.68 kg/m   Body mass index is 26.68 kg/m.  Advanced Directives 10/15/2018 12/14/2017 09/24/2017 09/08/2017 06/29/2013 06/23/2013  Does Patient Have a Medical Advance Directive? Yes No Yes Yes Patient has advance directive, copy not in chart Patient has advance directive, copy not in chart  Type of Advance Directive Living will;Healthcare Power of [place];Living will [place];Living will [place];Living will Living will  Does patient want to make changes to medical advance directive? - - - No - Patient declined - -  Copy of [place] in Chart? No - copy requested - No - copy requested No - copy requested Copy requested from family (No Data)  Would patient like information on creating a medical advance directive? - No - Patient declined - - - -  Pre-existing out of facility DNR order (yellow  form or pink MOST form) - - - - No No    Tobacco Social History   Tobacco Use  Smoking Status Never Smoker  Smokeless Tobacco Never Used     Counseling given: Not Answered    Past Medical History:  Diagnosis Date  . Amyloidosis ([place])   . Arthritis 06-23-13   osteoarthritis right hip/some left shoulder pain  . BPH (benign prostatic hyperplasia)   . Congestive heart failure (CHF) ([place])   . Right hip pain    FOLLOWS DR. FOR ARTHRITIS   Past Surgical History:  Procedure Laterality Date  . REFRACTIVE SURGERY    . TOTAL HIP ARTHROPLASTY Right 06/29/2013   Procedure: RIGHT TOTAL HIP ARTHROPLASTY ANTERIOR APPROACH;  Surgeon: Mauri Pole, MD;  Location: [facility];  Service: Orthopedics;  Laterality: Right;  Marland Kitchen VASECTOMY     Family History  Problem Relation Age of Onset  . Coronary artery disease Mother   . Hypertension Mother   . Kidney failure Mother   . Heart attack Father   . Heart disease Father 36       MI  . Cerebral palsy Son    Social History   Socioeconomic History  . Marital status: Married    Spouse name: Not on file  . Number of children: 2  . Years of education: 59  . Highest education level: Not on file  Occupational History  . Not on file  Social  Needs  . Financial resource strain: Not on file  . Food insecurity:    Worry: Not on file    Inability: Not on file  . Transportation needs:    Medical: Not on file    Non-medical: Not on file  Tobacco Use  . Smoking status: Never Smoker  . Smokeless tobacco: Never Used  Substance and Sexual Activity  . Alcohol use: Yes    Comment: weekends, socially  . Drug use: No  . Sexual activity: Yes  Lifestyle  . Physical activity:    Days per week: Not on file    Minutes per session: Not on file  . Stress: Not on file  Relationships  . Social connections:    Talks on phone: Not on file    Gets together: Not on file    Attends religious service: Not on file    Active member of club or organization: Not on  file    Attends meetings of clubs or organizations: Not on file    Relationship status: Not on file  Other Topics Concern  . Not on file  Social History Narrative   Lives with wife in a 2 story home.  Has 2 children.     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     Education: college.     Outpatient Encounter Medications as of 10/15/2018  Medication Sig  . acyclovir (ZOVIRAX) 200 MG capsule Take 400 mg by mouth 2 (two) times daily.   Marland Kitchen b complex vitamins tablet Take 1 tablet by mouth daily.  Marland Kitchen eplerenone (INSPRA) 25 MG tablet Take by mouth.  . furosemide (LASIX) 20 MG tablet Take 1 tablet (20 mg total) by mouth daily as needed for edema.  . gabapentin (NEURONTIN) 100 MG capsule TAKE 1 CAPSULE BY MOUTH THREE TIMES DAILY  . gabapentin (NEURONTIN) 300 MG capsule Take 1 capsule (300 mg total) by mouth at bedtime.  Marland Kitchen omeprazole (PRILOSEC) 20 MG capsule Take 20 mg by mouth daily.  Marland Kitchen oxymetazoline (AFRIN) 0.05 % nasal spray Place 3 sprays into both nostrils daily as needed for congestion.  . potassium chloride (K-DUR) 10 MEQ tablet Take 1 tablet (10 mEq total) by mouth daily as needed (Takes with Lasix).  . bortezomib IV (VELCADE) 3.5 MG injection Inject 1.3 mg/m2 into the vein once.  . doxycycline (VIBRA-TABS) 100 MG tablet Take 100 mg by mouth 2 (two) times daily.   No facility-administered encounter medications on file as of 10/15/2018.     Activities of Daily Living In your present state of health, do you have any difficulty performing the following activities: 10/15/2018 12/15/2017  Hearing? N N  Vision? N N  Difficulty concentrating or making decisions? N N  Walking or climbing stairs? N N  Dressing or bathing? N N  Doing errands, shopping? N N  Preparing Food and eating ? N -  Using the Toilet? N -  In the past six months, have you accidently leaked urine? N -  Do you have problems with loss of bowel control? N -  Managing your Medications? N -  Managing your Finances? N -    Housekeeping or managing your Housekeeping? N -  Some recent data might be hidden    Patient Care Team: Midge Minium, MD as PCP - General Wilford Corner, MD as Consulting Physician (Gastroenterology) Park Liter, MD as Consulting Physician (Cardiology) Dermatology, [facility] (Dermatology) Alda Berthold, DO as Consulting Physician (Neurology) Dyann Kief, DO  as Referring Physician (Hematology) Sandrea Hammond, MD as Referring Physician (Cardiology) Fransico Him, MD as Referring Physician (Cardiology) Ranee Gosselin, MD as Referring Physician (Neurology)   Assessment:   This is a routine wellness examination for [place].  Exercise Activities and Dietary recommendations Current Exercise Habits: Home exercise routine(golf 3/week), Type of exercise: walking;strength training/weights, Time (Minutes): > 60, Frequency (Times/Week): 5, Weekly Exercise (Minutes/Week): 0, Exercise limited by: None identified   Diet (meal preparation, eat out, water intake, caffeinated beverages, dairy products, fruits and vegetables): Drinks water, premier protein shake, occasionally soda.   Breakfast: fruit; cereal; eggs/toast; waffles; oatmeal; coffee  Lunch: sandwich; fruits Dinner: protein and vegetables  Goals      Patient Stated   . patient states (pt-stated)     Continue to be as active as possible.       Other   . Patient Stated     Maintain/increase activity.        Fall Risk Fall Risk  10/15/2018 09/24/2017 09/22/2017 09/03/2017 09/19/2016  Falls in the past year? 0 No No No No    Depression Screen PHQ 2/9 Scores 10/15/2018 09/24/2017 09/03/2017 09/19/2016  PHQ - 2 Score 0 0 0 0  PHQ- 9 Score - - 0 -    Cognitive Function MMSE - Mini Mental State Exam 10/15/2018  Orientation to time 5  Orientation to Place 5  Registration 3  Attention/ Calculation 5  Recall 3  Language- name 2 objects 2  Language- repeat 1   Language- follow 3 step command 3  Language- read & follow direction 1  Write a sentence 1  Copy design 1  Total score 30        Immunization History  Administered Date(s) Administered  . Influenza, High Dose Seasonal PF 09/24/2017  . Influenza,inj,Quad PF,6+ Mos 09/13/2015, 09/19/2016  . Pneumococcal Conjugate-13 09/08/2014  . Pneumococcal Polysaccharide-23 07/30/2002, 09/24/2017  . Td 12/15/2006    Screening Tests Health Maintenance  Topic Date Due  . INFLUENZA VACCINE  07/09/2018  . TETANUS/TDAP  10/16/2019 (Originally 12/15/2016)  . COLONOSCOPY  04/18/2026  . Hepatitis C Screening  Completed  . PNA vac Low Risk Adult  Completed        Plan:     Flu and shingles vaccine.   Bring a copy of your living will and/or healthcare power of attorney to your next office visit.  Continue doing brain stimulating activities (puzzles, reading, adult coloring books, staying active) to keep memory sharp.   I have personally reviewed and noted the following in the patient's chart:   . Medical and social history . Use of alcohol, tobacco or illicit drugs  . Current medications and supplements . Functional ability and status . Nutritional status . Physical activity . Advanced directives . List of other physicians . Hospitalizations, surgeries, and ER visits in previous 12 months . Vitals . Screenings to include cognitive, depression, and falls . Referrals and appointments  In addition, I have reviewed and discussed with patient certain preventive protocols, quality metrics, and best practice recommendations. A written personalized care plan for preventive services as well as general preventive health recommendations were provided to patient.     Gerilyn Nestle, RN  10/15/2018  Reviewed documentation provided by RN and agree w/ above.  Annye Asa, MD

## 2018-10-15 ENCOUNTER — Encounter: Payer: Self-pay | Admitting: Family Medicine

## 2018-10-15 ENCOUNTER — Ambulatory Visit: Payer: Medicare Other | Admitting: Family Medicine

## 2018-10-15 ENCOUNTER — Ambulatory Visit (INDEPENDENT_AMBULATORY_CARE_PROVIDER_SITE_OTHER): Payer: Medicare Other

## 2018-10-15 ENCOUNTER — Other Ambulatory Visit: Payer: Self-pay

## 2018-10-15 VITALS — BP 128/60 | HR 72 | Temp 98.0°F | Resp 16 | Ht 73.0 in | Wt 202.2 lb

## 2018-10-15 DIAGNOSIS — E663 Overweight: Secondary | ICD-10-CM

## 2018-10-15 DIAGNOSIS — E8581 Light chain (AL) amyloidosis: Secondary | ICD-10-CM | POA: Diagnosis not present

## 2018-10-15 DIAGNOSIS — I43 Cardiomyopathy in diseases classified elsewhere: Secondary | ICD-10-CM | POA: Diagnosis not present

## 2018-10-15 DIAGNOSIS — N401 Enlarged prostate with lower urinary tract symptoms: Secondary | ICD-10-CM

## 2018-10-15 DIAGNOSIS — Z0000 Encounter for general adult medical examination without abnormal findings: Secondary | ICD-10-CM

## 2018-10-15 DIAGNOSIS — R351 Nocturia: Secondary | ICD-10-CM | POA: Diagnosis not present

## 2018-10-15 DIAGNOSIS — I4581 Long QT syndrome: Secondary | ICD-10-CM | POA: Diagnosis not present

## 2018-10-15 DIAGNOSIS — E859 Amyloidosis, unspecified: Secondary | ICD-10-CM

## 2018-10-15 DIAGNOSIS — E854 Organ-limited amyloidosis: Secondary | ICD-10-CM | POA: Diagnosis not present

## 2018-10-15 LAB — LIPID PANEL
CHOLESTEROL: 200 mg/dL (ref 0–200)
HDL: 81.3 mg/dL (ref >39.00)
LDL Cholesterol: 104 mg/dL — ABNORMAL HIGH (ref 0–99)
NonHDL: 118.38
TRIGLYCERIDES: 70 mg/dL (ref 0.0–149.0)
Total CHOL/HDL Ratio: 2
VLDL: 14 mg/dL (ref 0.0–40.0)

## 2018-10-15 LAB — PSA, MEDICARE: PSA: 0.16 ng/ml (ref 0.10–4.00)

## 2018-10-15 LAB — TSH: TSH: 3.36 u[IU]/mL (ref 0.35–4.50)

## 2018-10-15 NOTE — Assessment & Plan Note (Signed)
Pt is down 25 lbs since last visit due to chemo and diuresis.  Will defer CMP, CBC to [facility] but will check lipids and TSH.

## 2018-10-15 NOTE — Assessment & Plan Note (Signed)
Check PSA level and determine if additional tx needed.

## 2018-10-15 NOTE — Patient Instructions (Signed)
Follow up in 1 year or as needed We'll notify you of your lab results and make any changes if needed Keep up the good work on healthy diet and regular exercise- you look great!!! Call with any questions or concerns Happy Holidays!!

## 2018-10-15 NOTE — Progress Notes (Signed)
   Subjective:    Patient ID: Bobby Harper, male    DOB: 05-12-1946, 72 y.o.   MRN: [id number]  HPI Amyloidosis- chronic problem.  Finished chemo at [facility] in June.  Pt is going to [facility] every 6 weeks.  Plan is to space out to q12 weeks if labs today allow.  Swelling has improved- on 60mg  Lasix BID and Inspra 25mg  daily.  No CP, SOB.  Continues to have neuropathy.  BPH- pt continues to have nocturia.  Due for PSA levels.  Overweight- pt's down 25 lbs since January due to chemo and diuresis.  Pt feels he is currently at his 'dry weight'.     Review of Systems For ROS see HPI     Objective:   Physical Exam  Constitutional: He is oriented to person, place, and time. He appears well-developed and well-nourished. No distress.  HENT:  Head: Normocephalic and atraumatic.  Eyes: Pupils are equal, round, and reactive to light. Conjunctivae and EOM are normal.  Neck: Normal range of motion. Neck supple. No thyromegaly present.  Cardiovascular: Normal rate, regular rhythm, normal heart sounds and intact distal pulses.  No murmur heard. Pulmonary/Chest: Effort normal and breath sounds normal. No respiratory distress.  Abdominal: Soft. Bowel sounds are normal. He exhibits no distension.  Musculoskeletal: He exhibits no edema.  Lymphadenopathy:    He has no cervical adenopathy.  Neurological: He is alert and oriented to person, place, and time. No cranial nerve deficit.  Skin: Skin is warm and dry.  Psychiatric: He has a normal mood and affect. His behavior is normal.  Vitals reviewed.         Assessment & Plan:

## 2018-10-15 NOTE — Patient Instructions (Addendum)
Flu and shingles vaccine.   Bring a copy of your living will and/or healthcare power of attorney to your next office visit.  Continue doing brain stimulating activities (puzzles, reading, adult coloring books, staying active) to keep memory sharp.    Health Maintenance, Male A healthy lifestyle and preventive care is important for your health and wellness. Ask your health care provider about what schedule of regular examinations is right for you. What should I know about weight and diet? Eat a Healthy Diet  Eat plenty of vegetables, fruits, whole grains, low-fat dairy products, and lean protein.  Do not eat a lot of foods high in solid fats, added sugars, or salt.  Maintain a Healthy Weight Regular exercise can help you achieve or maintain a healthy weight. You should:  Do at least 150 minutes of exercise each week. The exercise should increase your heart rate and make you sweat (moderate-intensity exercise).  Do strength-training exercises at least twice a week.  Watch Your Levels of Cholesterol and Blood Lipids  Have your blood tested for lipids and cholesterol every 5 years starting at 71 years of age. If you are at high risk for heart disease, you should start having your blood tested when you are 71 years old. You may need to have your cholesterol levels checked more often if: ? Your lipid or cholesterol levels are high. ? You are older than 71 years of age. ? You are at high risk for heart disease.  What should I know about cancer screening? Many types of cancers can be detected early and may often be prevented. Lung Cancer  You should be screened every year for lung cancer if: ? You are a current smoker who has smoked for at least 30 years. ? You are a former smoker who has quit within the past 15 years.  Talk to your health care provider about your screening options, when you should start screening, and how often you should be screened.  Colorectal Cancer  Routine  colorectal cancer screening usually begins at 71 years of age and should be repeated every 5-10 years until you are 70 years old. You may need to be screened more often if early forms of precancerous polyps or small growths are found. Your health care provider may recommend screening at an earlier age if you have risk factors for colon cancer.  Your health care provider may recommend using home test kits to check for hidden blood in the stool.  A small camera at the end of a tube can be used to examine your colon (sigmoidoscopy or colonoscopy). This checks for the earliest forms of colorectal cancer.  Prostate and Testicular Cancer  Depending on your age and overall health, your health care provider may do certain tests to screen for prostate and testicular cancer.  Talk to your health care provider about any symptoms or concerns you have about testicular or prostate cancer.  Skin Cancer  Check your skin from head to toe regularly.  Tell your health care provider about any new moles or changes in moles, especially if: ? There is a change in a mole's size, shape, or color. ? You have a mole that is larger than a pencil eraser.  Always use sunscreen. Apply sunscreen liberally and repeat throughout the day.  Protect yourself by wearing long sleeves, pants, a wide-brimmed hat, and sunglasses when outside.  What should I know about heart disease, diabetes, and high blood pressure?  If you are 28-39 years of age,  have your blood pressure checked every 3-5 years. If you are 68 years of age or older, have your blood pressure checked every year. You should have your blood pressure measured twice-once when you are at a hospital or clinic, and once when you are not at a hospital or clinic. Record the average of the two measurements. To check your blood pressure when you are not at a hospital or clinic, you can use: ? An automated blood pressure machine at a pharmacy. ? A home blood pressure  monitor.  Talk to your health care provider about your target blood pressure.  If you are between 51-54 years old, ask your health care provider if you should take aspirin to prevent heart disease.  Have regular diabetes screenings by checking your fasting blood sugar level. ? If you are at a normal weight and have a low risk for diabetes, have this test once every three years after the age of 47. ? If you are overweight and have a high risk for diabetes, consider being tested at a younger age or more often.  A one-time screening for abdominal aortic aneurysm (AAA) by ultrasound is recommended for men aged 66-75 years who are current or former smokers. What should I know about preventing infection? Hepatitis B If you have a higher risk for hepatitis B, you should be screened for this virus. Talk with your health care provider to find out if you are at risk for hepatitis B infection. Hepatitis C Blood testing is recommended for:  Everyone born from [birth year] through 1964.  Anyone with known risk factors for hepatitis C.  Sexually Transmitted Diseases (STDs)  You should be screened each year for STDs including gonorrhea and chlamydia if: ? You are sexually active and are younger than 71 years of age. ? You are older than 71 years of age and your health care provider tells you that you are at risk for this type of infection. ? Your sexual activity has changed since you were last screened and you are at an increased risk for chlamydia or gonorrhea. Ask your health care provider if you are at risk.  Talk with your health care provider about whether you are at high risk of being infected with HIV. Your health care provider may recommend a prescription medicine to help prevent HIV infection.  What else can I do?  Schedule regular health, dental, and eye exams.  Stay current with your vaccines (immunizations).  Do not use any tobacco products, such as cigarettes, chewing tobacco, and  e-cigarettes. If you need help quitting, ask your health care provider.  Limit alcohol intake to no more than 2 drinks per day. One drink equals 12 ounces of beer, 5 ounces of wine, or 1 ounces of hard liquor.  Do not use street drugs.  Do not share needles.  Ask your health care provider for help if you need support or information about quitting drugs.  Tell your health care provider if you often feel depressed.  Tell your health care provider if you have ever been abused or do not feel safe at home. This information is not intended to replace advice given to you by your health care provider. Make sure you discuss any questions you have with your health care provider. Document Released: 05/23/2008 Document Revised: 07/24/2016 Document Reviewed: 08/29/2015 Elsevier Interactive Patient Education  [employer].

## 2018-10-15 NOTE — Assessment & Plan Note (Signed)
Ongoing problem for pt.  Following at [facility] and is currently symptom free w/ exception of neuropathy.

## 2018-10-30 ENCOUNTER — Other Ambulatory Visit: Payer: Self-pay | Admitting: Neurology

## 2018-11-04 DIAGNOSIS — L570 Actinic keratosis: Secondary | ICD-10-CM | POA: Diagnosis not present

## 2018-11-04 DIAGNOSIS — D485 Neoplasm of uncertain behavior of skin: Secondary | ICD-10-CM | POA: Diagnosis not present

## 2018-11-09 ENCOUNTER — Other Ambulatory Visit: Payer: Self-pay | Admitting: Neurology

## 2018-11-10 ENCOUNTER — Other Ambulatory Visit: Payer: Self-pay | Admitting: *Deleted

## 2018-11-10 MED ORDER — GABAPENTIN 100 MG PO CAPS: 100.0000 mg | ORAL_CAPSULE | Freq: Three times a day (TID) | ORAL | 0 refills | Status: AC

## 2018-11-10 NOTE — Telephone Encounter (Signed)
Please find out how much gabapentin patient is taking because he was also seen at [facility] in July for neuropathy and they may have suggested medication changes.  If he plans to follow-up there, it's best to request refills through their office.  Otherwise, he does need a follow-up appointment with me, if he would like for me to continue write his prescriptions - ok to give 90-day supply until he can be seen.

## 2018-11-10 NOTE — Telephone Encounter (Signed)
Called patient and left message for him to call me back.  

## 2018-11-10 NOTE — Telephone Encounter (Signed)
I spoke with patient and he said that he only needs the 100 mg gabapentin refilled, #90.  He takes 400 qam, 300 noon and 400 qpm.  He will be following with [facility] and will have his medications transferred.  #90 sent to pharmacy.

## 2018-12-08 DIAGNOSIS — E8581 Light chain (AL) amyloidosis: Secondary | ICD-10-CM | POA: Diagnosis not present

## 2018-12-08 DIAGNOSIS — G63 Polyneuropathy in diseases classified elsewhere: Secondary | ICD-10-CM | POA: Diagnosis not present

## 2018-12-08 DIAGNOSIS — E851 Neuropathic heredofamilial amyloidosis: Secondary | ICD-10-CM | POA: Diagnosis not present

## 2018-12-24 DIAGNOSIS — E851 Neuropathic heredofamilial amyloidosis: Secondary | ICD-10-CM | POA: Diagnosis not present

## 2018-12-24 DIAGNOSIS — E8581 Light chain (AL) amyloidosis: Secondary | ICD-10-CM | POA: Diagnosis not present

## 2018-12-24 DIAGNOSIS — G63 Polyneuropathy in diseases classified elsewhere: Secondary | ICD-10-CM | POA: Diagnosis not present

## 2019-01-20 ENCOUNTER — Ambulatory Visit (INDEPENDENT_AMBULATORY_CARE_PROVIDER_SITE_OTHER): Payer: Medicare Other

## 2019-01-20 DIAGNOSIS — Z23 Encounter for immunization: Secondary | ICD-10-CM | POA: Diagnosis not present

## 2019-02-04 DIAGNOSIS — E8581 Light chain (AL) amyloidosis: Secondary | ICD-10-CM | POA: Diagnosis not present

## 2019-02-04 DIAGNOSIS — E8809 Other disorders of plasma-protein metabolism, not elsewhere classified: Secondary | ICD-10-CM | POA: Diagnosis not present

## 2019-02-10 DIAGNOSIS — Z85828 Personal history of other malignant neoplasm of skin: Secondary | ICD-10-CM | POA: Diagnosis not present

## 2019-02-10 DIAGNOSIS — Z08 Encounter for follow-up examination after completed treatment for malignant neoplasm: Secondary | ICD-10-CM | POA: Diagnosis not present

## 2019-02-10 DIAGNOSIS — L570 Actinic keratosis: Secondary | ICD-10-CM | POA: Diagnosis not present

## 2019-03-05 DIAGNOSIS — E8581 Light chain (AL) amyloidosis: Secondary | ICD-10-CM | POA: Diagnosis not present

## 2019-03-23 DIAGNOSIS — G63 Polyneuropathy in diseases classified elsewhere: Secondary | ICD-10-CM | POA: Diagnosis not present

## 2019-03-23 DIAGNOSIS — E851 Neuropathic heredofamilial amyloidosis: Secondary | ICD-10-CM | POA: Diagnosis not present

## 2019-03-23 DIAGNOSIS — E8581 Light chain (AL) amyloidosis: Secondary | ICD-10-CM | POA: Diagnosis not present

## 2019-03-25 DIAGNOSIS — E8581 Light chain (AL) amyloidosis: Secondary | ICD-10-CM | POA: Diagnosis not present

## 2019-03-25 DIAGNOSIS — G63 Polyneuropathy in diseases classified elsewhere: Secondary | ICD-10-CM | POA: Diagnosis not present

## 2019-03-25 DIAGNOSIS — E854 Organ-limited amyloidosis: Secondary | ICD-10-CM | POA: Diagnosis not present

## 2019-03-25 DIAGNOSIS — E851 Neuropathic heredofamilial amyloidosis: Secondary | ICD-10-CM | POA: Diagnosis not present

## 2019-04-06 DIAGNOSIS — E854 Organ-limited amyloidosis: Secondary | ICD-10-CM | POA: Diagnosis not present

## 2019-04-06 DIAGNOSIS — I43 Cardiomyopathy in diseases classified elsewhere: Secondary | ICD-10-CM | POA: Diagnosis not present

## 2019-04-23 DIAGNOSIS — M792 Neuralgia and neuritis, unspecified: Secondary | ICD-10-CM | POA: Diagnosis not present

## 2019-04-23 DIAGNOSIS — Z515 Encounter for palliative care: Secondary | ICD-10-CM | POA: Diagnosis not present

## 2019-04-29 DIAGNOSIS — E8581 Light chain (AL) amyloidosis: Secondary | ICD-10-CM | POA: Diagnosis not present

## 2019-05-06 DIAGNOSIS — E8581 Light chain (AL) amyloidosis: Secondary | ICD-10-CM | POA: Diagnosis not present

## 2019-06-02 IMAGING — US US BIOPSY
1 series · 6 of 6 positions shown · non-contrast
Comparison: none

INDICATION: Possible amyloidosis

[Series 1: us biopsy · 0.06mm/px · 6 of 6 slices shown]
[im 1/6]
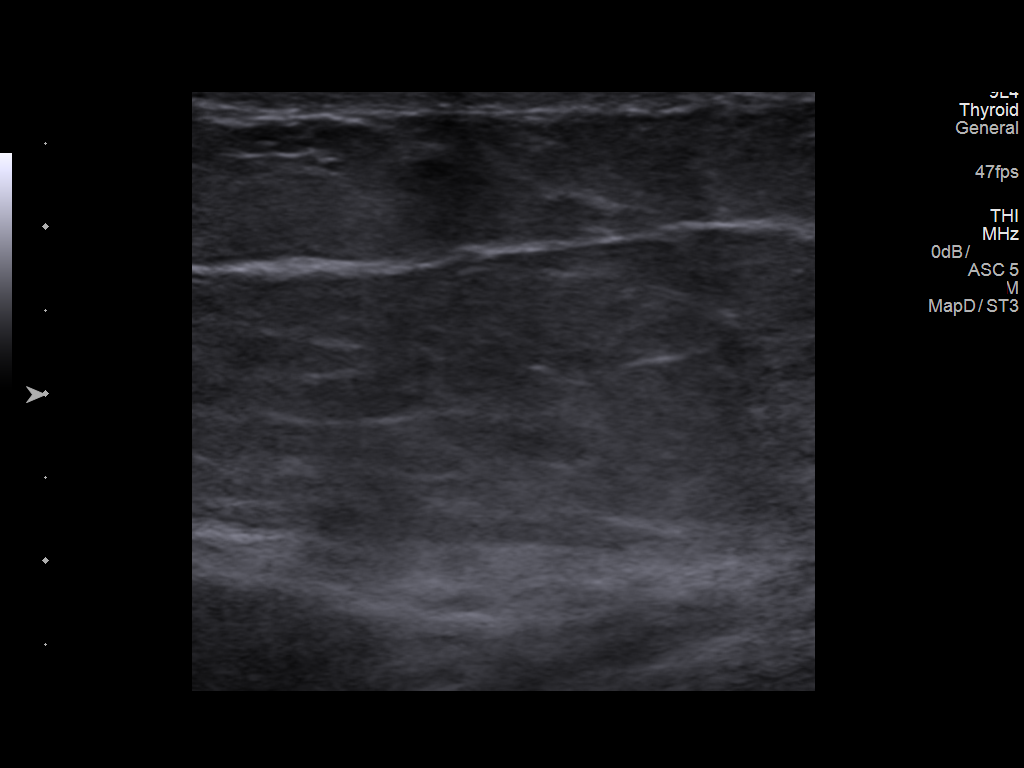
[im 2/6]
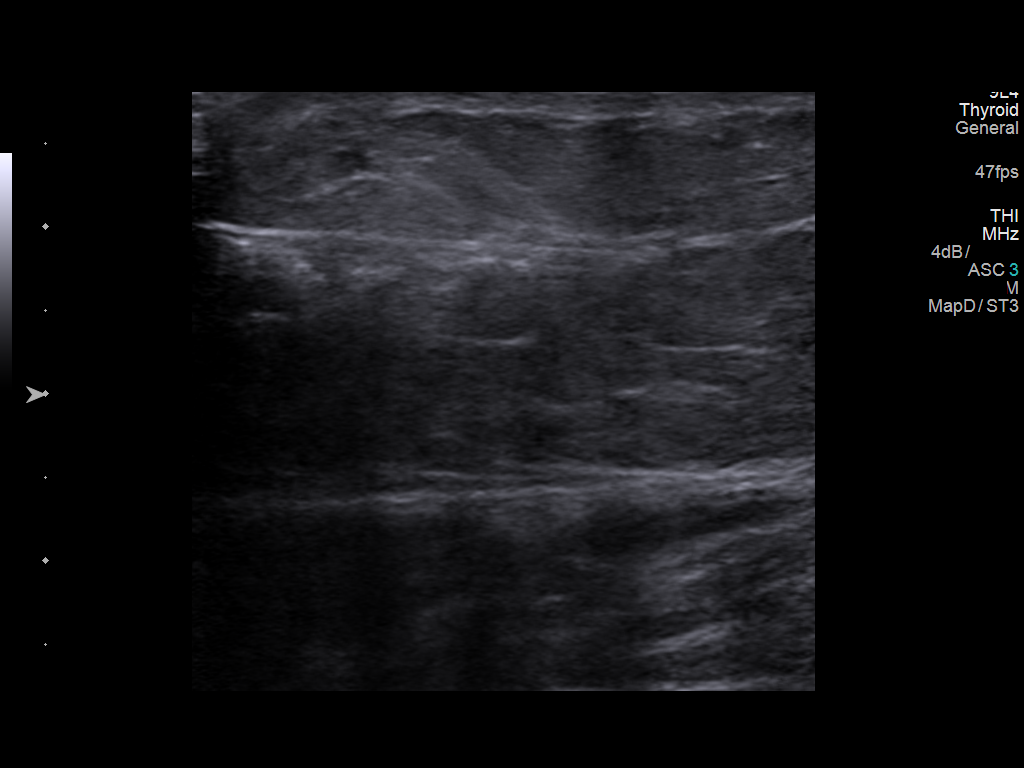
[im 3/6]
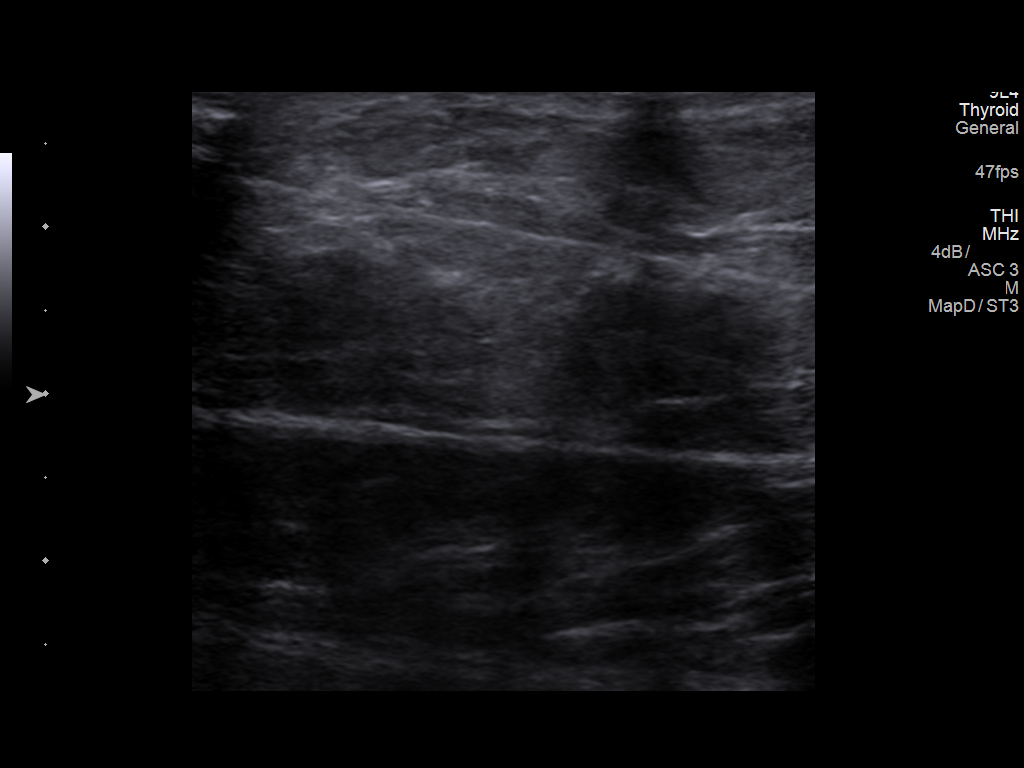
[im 4/6]
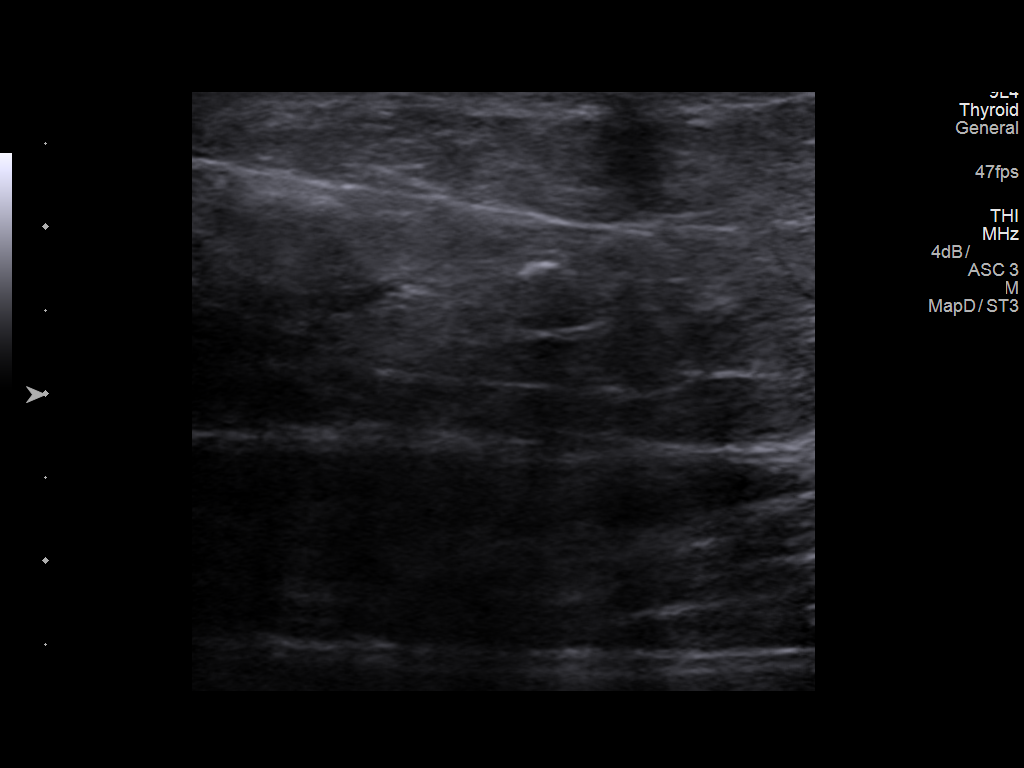
[im 5/6]
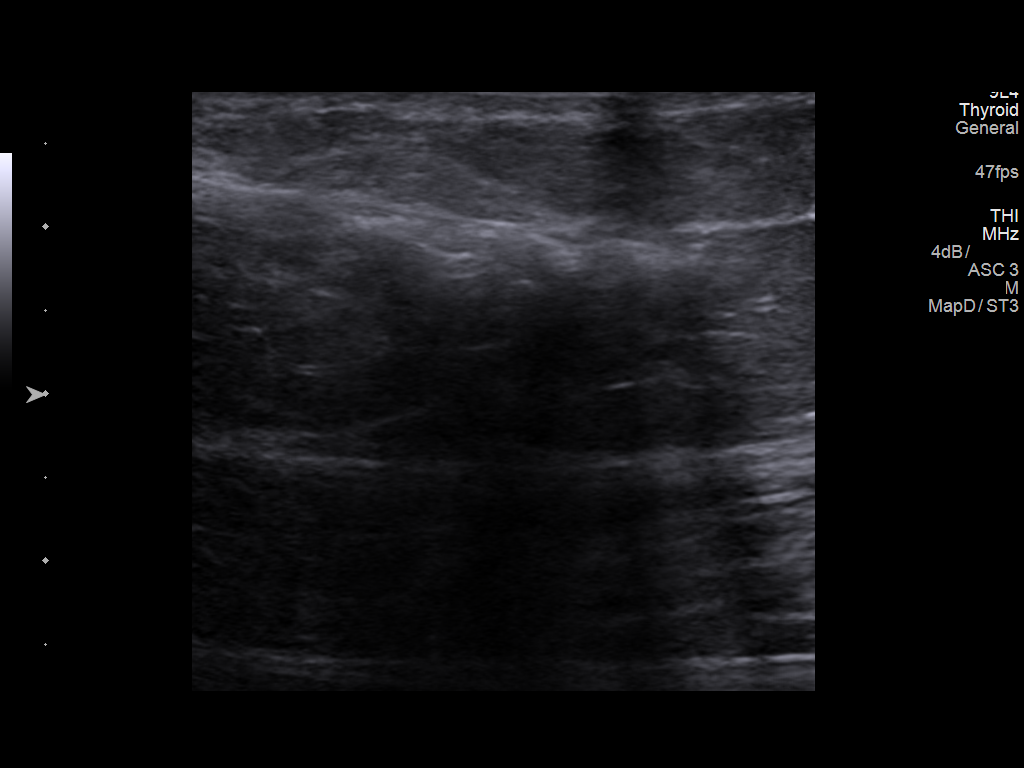
[im 6/6]
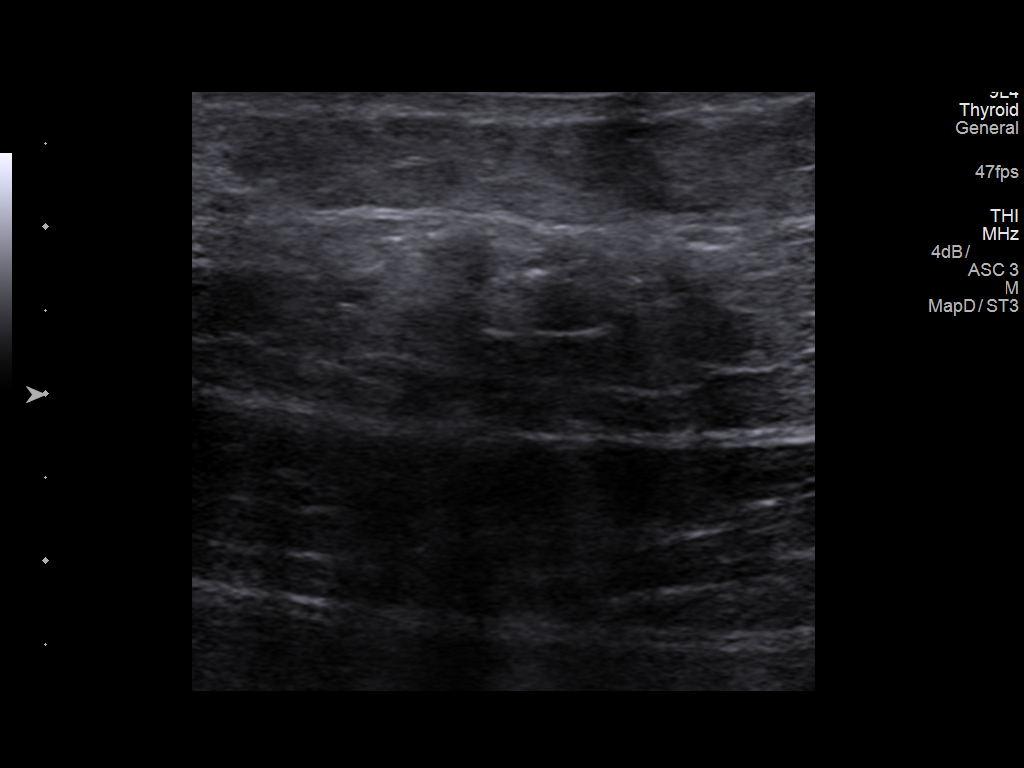

[6 of 6 positions shown; findings below may reference images not displayed]

EXAM:
ULTRASOUND-GUIDED BIOPSY FAT PAD

MEDICATIONS:
None.

ANESTHESIA/SEDATION:
Fentanyl 100 mcg IV; Versed 2 mg IV

Moderate Sedation Time:  10

The patient was continuously monitored during the procedure by the
interventional radiology nurse under my direct supervision.

FLUOROSCOPY TIME:  None

COMPLICATIONS:
None immediate.

PROCEDURE:
Informed written consent was obtained from the patient after a
thorough discussion of the procedural risks, benefits and
alternatives. All questions were addressed. Maximal Sterile Barrier
Technique was utilized including caps, mask, sterile gowns, sterile
gloves, sterile drape, hand hygiene and skin antiseptic. A timeout
was performed prior to the initiation of the procedure.

The right lower quadrant was prepped with ChloraPrep in a sterile
fashion, and a sterile drape was applied covering the operative
field. A sterile gown and sterile gloves were used for the
procedure.

Under sonographic guidance, five 16 gauge core biopsies of
subcutaneous fat were obtained. Final imaging was performed.

Patient tolerated the procedure well without complication. Vital
sign monitoring by nursing staff during the procedure will continue
as patient is in the special procedures unit for post procedure
observation.
FINDINGS: The images document guide needle placement within the right lower
quadrant subcutaneous fat. Post biopsy images demonstrate no
evidence of hemorrhage.
IMPRESSION: Successful ultrasound-guided core biopsy of subcutaneous fat.

## 2019-06-18 DIAGNOSIS — E8581 Light chain (AL) amyloidosis: Secondary | ICD-10-CM | POA: Diagnosis not present

## 2019-06-24 DIAGNOSIS — E8581 Light chain (AL) amyloidosis: Secondary | ICD-10-CM | POA: Diagnosis not present

## 2019-08-04 DIAGNOSIS — E8581 Light chain (AL) amyloidosis: Secondary | ICD-10-CM | POA: Diagnosis not present

## 2019-08-12 DIAGNOSIS — E8581 Light chain (AL) amyloidosis: Secondary | ICD-10-CM | POA: Diagnosis not present

## 2019-08-12 DIAGNOSIS — E8809 Other disorders of plasma-protein metabolism, not elsewhere classified: Secondary | ICD-10-CM | POA: Diagnosis not present

## 2019-10-21 ENCOUNTER — Ambulatory Visit: Payer: Medicare Other

## 2019-10-21 ENCOUNTER — Encounter: Payer: Medicare Other | Admitting: Family Medicine

## 2021-06-06 ENCOUNTER — Encounter: Payer: Self-pay | Admitting: *Deleted
